# Patient Record
Sex: Female | Born: 1970 | Race: White | Hispanic: No | Marital: Married | Smoking: Never smoker
Health system: Southern US, Community
[De-identification: ages and names within clinical notes are randomized; demographics above are authoritative.]

## PROBLEM LIST (undated history)

## (undated) DIAGNOSIS — E039 Hypothyroidism, unspecified: Secondary | ICD-10-CM

## (undated) DIAGNOSIS — E079 Disorder of thyroid, unspecified: Secondary | ICD-10-CM

## (undated) DIAGNOSIS — Z01419 Encounter for gynecological examination (general) (routine) without abnormal findings: Secondary | ICD-10-CM

## (undated) DIAGNOSIS — Z8639 Personal history of other endocrine, nutritional and metabolic disease: Secondary | ICD-10-CM

## (undated) DIAGNOSIS — R928 Other abnormal and inconclusive findings on diagnostic imaging of breast: Secondary | ICD-10-CM

## (undated) HISTORY — DX: Disorder of thyroid, unspecified: E07.9

## (undated) HISTORY — DX: Other abnormal and inconclusive findings on diagnostic imaging of breast: R92.8

## (undated) HISTORY — DX: Encounter for gynecological examination (general) (routine) without abnormal findings: Z01.419

## (undated) HISTORY — DX: Personal history of other endocrine, nutritional and metabolic disease: Z86.39

## (undated) HISTORY — DX: Hypothyroidism, unspecified: E03.9

---
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
## 2015-06-04 DIAGNOSIS — R928 Other abnormal and inconclusive findings on diagnostic imaging of breast: Secondary | ICD-10-CM | POA: Insufficient documentation

## 2015-06-29 DIAGNOSIS — R748 Abnormal levels of other serum enzymes: Secondary | ICD-10-CM | POA: Insufficient documentation

## 2015-06-29 DIAGNOSIS — O905 Postpartum thyroiditis: Secondary | ICD-10-CM | POA: Insufficient documentation

## 2015-06-29 DIAGNOSIS — E041 Nontoxic single thyroid nodule: Secondary | ICD-10-CM | POA: Insufficient documentation

## 2015-06-29 DIAGNOSIS — E038 Other specified hypothyroidism: Secondary | ICD-10-CM | POA: Insufficient documentation

## 2015-06-29 LAB — BASIC METABOLIC PANEL
BUN: 15 mg/dL (ref 4–21)
CREATININE: 0.7 mg/dL (ref 0.5–1.1)
Glucose: 71 mg/dL
Potassium: 4 mmol/L (ref 3.4–5.3)
SODIUM: 140 mmol/L (ref 137–147)

## 2015-06-29 LAB — HEPATIC FUNCTION PANEL
ALT: 9 U/L (ref 7–35)
AST: 14 U/L (ref 13–35)

## 2015-06-29 LAB — TSH: TSH: 2.76 u[IU]/mL (ref 0.41–5.90)

## 2015-07-01 LAB — T4
T3, Free: 3.5
T4, Total: 0.6
Thyroid Peroxidase Ab: 421

## 2015-07-15 ENCOUNTER — Ambulatory Visit (INDEPENDENT_AMBULATORY_CARE_PROVIDER_SITE_OTHER): Payer: BLUE CROSS/BLUE SHIELD | Admitting: Osteopathic Medicine

## 2015-07-15 ENCOUNTER — Encounter: Payer: Self-pay | Admitting: Osteopathic Medicine

## 2015-07-15 VITALS — BP 107/73 | HR 63 | Temp 98.1°F | Wt 177.2 lb

## 2015-07-15 DIAGNOSIS — R928 Other abnormal and inconclusive findings on diagnostic imaging of breast: Secondary | ICD-10-CM

## 2015-07-15 DIAGNOSIS — Z01419 Encounter for gynecological examination (general) (routine) without abnormal findings: Secondary | ICD-10-CM

## 2015-07-15 DIAGNOSIS — E038 Other specified hypothyroidism: Secondary | ICD-10-CM

## 2015-07-15 DIAGNOSIS — Z13220 Encounter for screening for lipoid disorders: Secondary | ICD-10-CM

## 2015-07-15 DIAGNOSIS — E039 Hypothyroidism, unspecified: Secondary | ICD-10-CM

## 2015-07-15 DIAGNOSIS — Z0000 Encounter for general adult medical examination without abnormal findings: Secondary | ICD-10-CM

## 2015-07-15 DIAGNOSIS — Z8639 Personal history of other endocrine, nutritional and metabolic disease: Secondary | ICD-10-CM

## 2015-07-15 DIAGNOSIS — E034 Atrophy of thyroid (acquired): Secondary | ICD-10-CM

## 2015-07-15 HISTORY — DX: Personal history of other endocrine, nutritional and metabolic disease: Z86.39

## 2015-07-15 HISTORY — DX: Hypothyroidism, unspecified: E03.9

## 2015-07-15 HISTORY — DX: Encounter for gynecological examination (general) (routine) without abnormal findings: Z01.419

## 2015-07-15 HISTORY — DX: Other abnormal and inconclusive findings on diagnostic imaging of breast: R92.8

## 2015-07-15 NOTE — Progress Notes (Signed)
HPI: Julia Flynn is a 44 y.o. female who presents to [facility]  today for chief complaint of:  Chief Complaint  Patient presents with  . New Patient (Initial Visit)    thyroid problem  . Annual Exam    Looking for new doctor - went to Endocrinologist recently and had labs done. Reviewed these as well as Pap and Mammo results.    Past medical, social and family history reviewed: Past Medical History  Diagnosis Date  . Thyroid disease    Past Surgical History  Procedure Laterality Date  . Cesarean section N/A 1999-2001-2007-2011   Social History  Substance Use Topics  . Smoking status: Never Smoker   . Smokeless tobacco: Not on file  . Alcohol Use: Not on file   No family history on file.  Current Outpatient Prescriptions  Medication Sig Dispense Refill  . levothyroxine (SYNTHROID, LEVOTHROID) 88 MCG tablet Take 88 mcg by mouth daily before breakfast.    . liothyronine (CYTOMEL) 5 MCG tablet Take 5 mcg by mouth daily. Pt takes 2.76mcg daily    . Loratadine 10 MG CAPS Take 10 mg by mouth.     No current facility-administered medications for this visit.   Allergies  Allergen Reactions  . Morphine Itching      Review of Systems: CONSTITUTIONAL:  No  fever, no chills, No  unintentional weight changes HEAD/EYES/EARS/NOSE/THROAT: No headache, no vision change, no hearing change, No  sore throat CARDIAC: No chest pain, no pressure/palpitations, no orthopnea RESPIRATORY: No  cough, No  shortness of breath/wheeze GASTROINTESTINAL: No nausea, no vomiting, no abdominal pain, no blood in stool, no diarrhea, no constipation MUSCULOSKELETAL: No  myalgia/arthralgia GENITOURINARY: No incontinence, No abnormal genital bleeding/discharge SKIN: No rash/wounds/concerning lesions HEM/ONC: No easy bruising/bleeding, no abnormal lymph node ENDOCRINE: No polyuria/polydipsia/polyphagia, no heat/cold intolerance  NEUROLOGIC: No weakness, no dizziness, no  slurred speech PSYCHIATRIC: No concerns with depression, no concerns with anxiety, no sleep problems    Exam:  BP 107/73 mmHg  Pulse 63  Temp(Src) 98.1 F (36.7 C)  Wt 177 lb 4 oz (80.4 kg)  LMP 07/07/2015 Constitutional: VSS, see above. General Appearance: alert, well-developed, well-nourished, NAD Eyes: Normal lids and conjunctive, non-icteric sclera, PERRLA Ears, Nose, Mouth, Throat: Normal external inspection ears/nares/mouth/lips/gums, TM normal bilaterally, MMM, posterior pharynx No  erythema No  exudate Neck: No masses, trachea midline. No thyroid enlargement/tenderness/mass appreciated. No lymphadenopathy Respiratory: Normal respiratory effort. no wheeze, no rhonchi, no rales Cardiovascular: S1/S2 normal, no murmur, no rub/gallop auscultated. RRR.  No carotid bruit or JVD. No abdominal aortic bruit.  Pedal pulse II/IV bilaterally DP and PT.  No lower extremity edema. Gastrointestinal: Nontender, no masses. No hepatomegaly, no splenomegaly. No hernia appreciated. Bowel sounds normal. Rectal exam deferred.  Musculoskeletal: Gait normal. No clubbing/cyanosis of digits.  Neurological: No cranial nerve deficit on limited exam. Motor and sensation intact and symmetric Psychiatric: Normal judgment/insight. Normal mood and affect. Oriented x3.    No results found for this or any previous visit (from the past 72 hour(s)).  Patient brings results: Thyroid is normal CMP normal Mammo Birads 0  Pap NILM HPV neg   ASSESSMENT/PLAN:  No diagnosis found.   FEMALE PREVENTIVE CARE  ANNUAL SCREENING/COUNSELING Tobacco - Never  Alcohol - none Diet/Exercise - HEALTHY HABITS DISCUSSED TO DECREASE CV RISK - walks 3 -4 times per week, cardio fitness weekly Sexual Health - Yes with female. STI - The patient denies history of sexually transmitted disease. INTERESTED IN STI TESTING -  no Depression - PQH2 Negative Domestic violence concerns - no HTN SCREENING - SEE VITALS Vaccination  status - SEE BELOW  INFECTIOUS DISEASE SCREENING HIV - all adults 15-65 - does not need GC/CT - sexually active - does not need HepC - born [id number] - does not need TB - if risk/required by employer - does not need  DISEASE SCREENING Lipid - (Low risk screen M35/F45; High risk screen M25/F35 if HTN, Tob, FH CHD M<55/F<65) - needs DM2 (45+ or Risk = FH 1st deg DM, Hx GDM, overweight/sedentary, high-risk ethnicity, HTN) - needs Osteoporosis - age 9+ or one sooner if risk - does not need  CANCER SCREENING Cervical - Pap q3 yr age 45+, Pap + HPV q5y age 45+ - PAP - does not need Breast - Mammo age 440+ (C) and biennial age 950-75 (A) - MAMMO - does not need Lung - annual low dose CT Chest age 45-75 w/ 30+ PY, current/quit past 15 years - CT - does not need Colon - age 45+ or 45 years of age prior to FH Dx - GI REFERRAL - does not need  ADULT VACCINATION Influenza - annual - was offered and declined by the patient Td booster every 10 years - was not indicated HPV - age 20<26yo - was not indicated Zoster - age 45+ - was not indicated Pneumonia - age 9+ sooner if risk (DM, smoker, other) - was not indicated  OTHER Fall - exercise and Vit D age 9+ - does not need Consider ASA - age 45-59 - does not need  Return in about 1 year (around 07/14/2016), or sooner if any problems arise, for ANNUAL PHYSICAL.

## 2015-07-27 ENCOUNTER — Encounter: Payer: Self-pay | Admitting: Emergency Medicine

## 2015-07-27 LAB — ESTIMATED GFR: EGFR (Non-African Amer.): >60

## 2015-10-04 ENCOUNTER — Encounter: Payer: Self-pay | Admitting: Emergency Medicine

## 2015-10-04 ENCOUNTER — Emergency Department (INDEPENDENT_AMBULATORY_CARE_PROVIDER_SITE_OTHER)
Admission: EM | Admit: 2015-10-04 | Discharge: 2015-10-04 | Disposition: A | Discharge status: Home / Self Care | Payer: BLUE CROSS/BLUE SHIELD | Source: Home / Self Care | Attending: Family Medicine | Admitting: Family Medicine

## 2015-10-04 DIAGNOSIS — J069 Acute upper respiratory infection, unspecified: Secondary | ICD-10-CM | POA: Diagnosis not present

## 2015-10-04 DIAGNOSIS — B9789 Other viral agents as the cause of diseases classified elsewhere: Principal | ICD-10-CM

## 2015-10-04 LAB — POCT RAPID STREP A (OFFICE): Rapid Strep A Screen: NEGATIVE

## 2015-10-04 NOTE — ED Notes (Signed)
Patient reports onset of cold-like symptoms 4 days ago; denies fever; some hoarseness.

## 2015-10-04 NOTE — Discharge Instructions (Signed)
Take plain guaifenesin (  extended release tabs such as Mucinex) twice daily, with plenty of water, for cough and congestion.  May add Pseudoephedrine ( , one or two every 4 to 6 hours) for sinus congestion.  Get adequate rest.   May use Afrin nasal spray (or generic oxymetazoline) twice daily for about 5 days and then discontinue.  Also recommend using saline nasal spray several times daily and saline nasal irrigation (AYR is a common brand).   Try warm salt water gargles for sore throat.  Stop all antihistamines for now, and other non-prescription cough/cold preparations. May take Ibuprofen , 4 tabs every 8 hours with food for headache, body aches, etc. May take Delsym Cough Suppressant at bedtime for nighttime cough.  Follow-up with family doctor if not improving about10 days.

## 2015-10-04 NOTE — ED Provider Notes (Signed)
CSN: [id number]     Arrival date & time 10/04/15  1549 History   First MD Initiated Contact with Patient 10/04/15 1645     Chief Complaint  Patient presents with  . Hoarse  . Sore Throat     HPI Comments: Patient complains of four day history of typical cold-like symptoms including mild sore throat, sinus congestion, fatigue, and cough.   The history is provided by the patient.    Past Medical History  Diagnosis Date  . Thyroid disease   . H/O Hashimoto thyroiditis 07/15/2015  . Hypothyroidism 07/15/2015  . Abnormal mammogram 07/15/2015    BIRADS-05/25/2015, following every 6 months to follow califications  . Well woman exam 07/15/2015    Pap NILM, HPV neg 2014   Past Surgical History  Procedure Laterality Date  . Cesarean section N/A 1999-2001-2007-2011   History reviewed. No pertinent family history. Social History  Substance Use Topics  . Smoking status: Never Smoker   . Smokeless tobacco: None  . Alcohol Use: None   OB History    No data available     Review of Systems + sore throat + hoarse + cough No pleuritic pain + wheezing + nasal congestion + post-nasal drainage No sinus pain/pressure No itchy/red eyes No earache No hemoptysis No SOB No fever/chills No nausea No vomiting No abdominal pain No diarrhea No urinary symptoms No skin rash + fatigue No myalgias No headache Used OTC meds without relief  Allergies  Morphine  Home Medications   Prior to Admission medications   Medication Sig Start Date End Date Taking? Authorizing Provider  levothyroxine (SYNTHROID, LEVOTHROID) 88 MCG tablet Take 88 mcg by mouth daily before breakfast.    Historical Provider, MD  liothyronine (CYTOMEL) 5 MCG tablet Take 5 mcg by mouth daily. Pt takes 2.20mcg daily    Historical Provider, MD  Loratadine 10 MG CAPS Take 10 mg by mouth.    Historical Provider, MD   Meds Ordered and Administered this Visit  Medications - No data to display  BP 90/57 mmHg  Pulse 57   Temp(Src) 98 F (36.7 C) (Oral)  Resp 16  Ht  (1.727 m)  Wt 175 lb (79.379 kg)  BMI 26.61 kg/m2  SpO2 99%  LMP 10/04/2015 (Exact Date) No data found.   Physical Exam Nursing notes and Vital Signs reviewed. Appearance:  Patient appears stated age, and in no acute distress Eyes:  Pupils are equal, round, and reactive to light and accomodation.  Extraocular movement is intact.  Conjunctivae are not inflamed  Ears:  Canals normal.  Tympanic membranes normal.  Nose:  Mildly congested turbinates.  No sinus tenderness.     Pharynx:  Normal Neck:  Supple.  Tonsillar nodes are tender but not enlarged. Tender enlarged posterior nodes are palpated bilaterally  Lungs:  Clear to auscultation.  Breath sounds are equal.  Moving air well. Heart:  Regular rate and rhythm without murmurs, rubs, or gallops.  Abdomen:  Nontender without masses or hepatosplenomegaly.  Bowel sounds are present.  No CVA or flank tenderness.  Extremities:  No edema.  Skin:  No rash present.   ED Course  Procedures none    Labs Reviewed  POCT RAPID STREP A (OFFICE) negative      MDM   1. Viral URI with cough     There is no evidence of bacterial infection today.   Take plain guaifenesin (  extended release tabs such as Mucinex) twice daily, with plenty of water, for cough  and congestion.  May add Pseudoephedrine ( , one or two every 4 to 6 hours) for sinus congestion.  Get adequate rest.   May use Afrin nasal spray (or generic oxymetazoline) twice daily for about 5 days and then discontinue.  Also recommend using saline nasal spray several times daily and saline nasal irrigation (AYR is a common brand).   Try warm salt water gargles for sore throat.  Stop all antihistamines for now, and other non-prescription cough/cold preparations. May take Ibuprofen , 4 tabs every 8 hours with food for headache, body aches, etc. May take Delsym Cough Suppressant at bedtime for nighttime cough.  Follow-up with  family doctor if not improving about10 days.     Lattie Haw, MD 10/07/15 [phone]

## 2016-03-01 ENCOUNTER — Telehealth: Payer: Self-pay | Admitting: Osteopathic Medicine

## 2016-03-01 NOTE — Telephone Encounter (Signed)
I received a call from patient and she is requesting to switch PCP from Dr. Lyn HollingsheadAlexander to Julia Flynn. I have scheduled her on Monday 6/26 with Jade if this is a problem please let me know. Thank you Arline Aspindy

## 2016-03-01 NOTE — Telephone Encounter (Signed)
Ok with me 

## 2016-03-07 ENCOUNTER — Encounter: Payer: Self-pay | Admitting: Physician Assistant

## 2016-03-07 ENCOUNTER — Ambulatory Visit (INDEPENDENT_AMBULATORY_CARE_PROVIDER_SITE_OTHER): Payer: BLUE CROSS/BLUE SHIELD | Admitting: Physician Assistant

## 2016-03-07 VITALS — BP 104/55 | HR 62 | Ht 68.0 in | Wt 183.0 lb

## 2016-03-07 DIAGNOSIS — E039 Hypothyroidism, unspecified: Secondary | ICD-10-CM | POA: Diagnosis not present

## 2016-03-07 DIAGNOSIS — Z8639 Personal history of other endocrine, nutritional and metabolic disease: Secondary | ICD-10-CM | POA: Diagnosis not present

## 2016-03-07 DIAGNOSIS — L821 Other seborrheic keratosis: Secondary | ICD-10-CM

## 2016-03-07 DIAGNOSIS — L301 Dyshidrosis [pompholyx]: Secondary | ICD-10-CM | POA: Diagnosis not present

## 2016-03-07 DIAGNOSIS — R928 Other abnormal and inconclusive findings on diagnostic imaging of breast: Secondary | ICD-10-CM

## 2016-03-07 MED ORDER — TRIAMCINOLONE ACETONIDE 0.1 % EX CREA: 1.0000 "application " | TOPICAL_CREAM | Freq: Two times a day (BID) | CUTANEOUS | Status: DC

## 2016-03-07 NOTE — Progress Notes (Signed)
   Subjective:    Patient ID: Julia Flynn, female    DOB: 10/02/1970, 45 y.o.   MRN: [id number]  HPI  Pt is a 45 yo female who presents to the clinic to have mole on her back looked at. Noticed growth for last 2 weeks. No bleeding. Bra irritates it. Moles removed in past no cancerous moles.   Hx of abnormal mammogram of left breast with calcifcations. Needs additional imaging.   Hx of dyshidrotic eczema. Brings in triamcinolone cream that needs refill on. Controlled with as needed cream.    Review of Systems  All other systems reviewed and are negative.      Objective:   Physical Exam  Constitutional: She is oriented to person, place, and time. She appears well-developed and well-nourished.  HENT:  Head: Normocephalic and atraumatic.  Cardiovascular: Normal rate, regular rhythm and normal heart sounds.   Pulmonary/Chest: Effort normal and breath sounds normal.  Neurological: She is alert and oriented to person, place, and time.  Skin:     Psychiatric: She has a normal mood and affect. Her behavior is normal.          Assessment & Plan:  Abnormal mammogram- ordered left diagnostic to follow up on breast calcifications. Last mammogram done at [facility] and in care everywhere.   Dyshidrotic eczema- triamcinolone cream as needed given.   seborrheic keratoses- cryotherapy done today. Reassurance given.  Cryotherapy Procedure Note  Pre-operative Diagnosis: seborrheic kerotosis  Post-operative Diagnosis: seborrheic kerotosis  Locations: mid back/bra line to the left of back.  Indications: irritation  Anesthesia: not required   Procedure Details  History of allergy to iodine: no. Pacemaker? no.  Patient informed of risks (permanent scarring, infection, light or dark discoloration, bleeding, infection, weakness, numbness and recurrence of the lesion) and benefits of the procedure and verbal informed consent obtained.  The areas are treated with liquid nitrogen therapy,  frozen until ice ball extended 2 mm beyond lesion, allowed to thaw, and treated again. The patient tolerated procedure well.  The patient was instructed on post-op care, warned that there may be blister formation, redness and pain. Recommend OTC analgesia as needed for pain.  Condition: Stable  Complications: none.  Plan: 1. Instructed to keep the area dry and covered for 24-48h and clean thereafter. 2. Warning signs of infection were reviewed.   3. Recommended that the patient use OTC acetaminophen as needed for pain.

## 2016-03-07 NOTE — Patient Instructions (Signed)
Seborrheic Keratosis Seborrheic keratosis is a common, noncancerous (benign) skin growth. This condition causes waxy, rough, tan, brown, or black spots to appear on the skin. These skin growths can be flat or raised. CAUSES The cause of this condition is not known. RISK FACTORS This condition is more likely to develop in:  People who have a family history of seborrheic keratosis.  People who are 50 or older.  People who are pregnant.  People who have had estrogen replacement therapy. SYMPTOMS This condition often occurs on the face, chest, shoulders, back, or other areas. These growths:  Are usually painless, but may become irritated and itchy.  Can be yellow, brown, black, or other colors.  Are slightly raised or have a flat surface.  Are sometimes rough or wart-like in texture.  Are often waxy on the surface.  Are round or oval-shaped.  Sometimes look like they are "stuck on."  Often occur in groups, but may occur as a single growth. DIAGNOSIS This condition is diagnosed with a medical history and physical exam. A sample of the growth may be tested (skin biopsy). You may need to see a skin specialist (dermatologist). TREATMENT Treatment is not usually needed for this condition, unless the growths are irritated or are often bleeding. You may also choose to have the growths removed if you do not like their appearance. Most commonly, these growths are treated with a procedure in which liquid nitrogen is applied to "freeze" off the growth (cryosurgery). They may also be burned off with electricity or cut off. HOME CARE INSTRUCTIONS  Watch your growth for any changes.  Keep all follow-up visits as told by your health care provider. This is important.  Do not scratch or pick at the growth or growths. This can cause them to become irritated or infected. SEEK MEDICAL CARE IF:  You suddenly have many new growths.  Your growth bleeds, itches, or hurts.  Your growth suddenly  becomes larger or changes color.   This information is not intended to replace advice given to you by your health care provider. Make sure you discuss any questions you have with your health care provider.   Document Released: 10/01/2010 Document Revised: 05/20/2015 Document Reviewed: 01/14/2015 Elsevier Interactive Patient Education 2016 Elsevier Inc.  

## 2016-03-25 ENCOUNTER — Ambulatory Visit: Admission: RE | Admit: 2016-03-25 | Payer: BLUE CROSS/BLUE SHIELD | Source: Ambulatory Visit

## 2016-03-25 ENCOUNTER — Other Ambulatory Visit: Payer: Self-pay | Admitting: Physician Assistant

## 2016-03-25 ENCOUNTER — Ambulatory Visit
Admission: RE | Admit: 2016-03-25 | Discharge: 2016-03-25 | Disposition: A | Discharge status: Home / Self Care | Payer: BLUE CROSS/BLUE SHIELD | Source: Ambulatory Visit | Attending: Physician Assistant | Admitting: Physician Assistant

## 2016-03-25 DIAGNOSIS — R928 Other abnormal and inconclusive findings on diagnostic imaging of breast: Secondary | ICD-10-CM

## 2016-06-10 ENCOUNTER — Emergency Department (INDEPENDENT_AMBULATORY_CARE_PROVIDER_SITE_OTHER)
Admission: EM | Admit: 2016-06-10 | Discharge: 2016-06-10 | Disposition: A | Discharge status: Home / Self Care | Payer: BLUE CROSS/BLUE SHIELD | Source: Home / Self Care | Attending: Family Medicine | Admitting: Family Medicine

## 2016-06-10 ENCOUNTER — Encounter: Payer: Self-pay | Admitting: Emergency Medicine

## 2016-06-10 DIAGNOSIS — B9789 Other viral agents as the cause of diseases classified elsewhere: Principal | ICD-10-CM

## 2016-06-10 DIAGNOSIS — J069 Acute upper respiratory infection, unspecified: Secondary | ICD-10-CM

## 2016-06-10 LAB — POCT RAPID STREP A (OFFICE): Rapid Strep A Screen: NEGATIVE

## 2016-06-10 MED ORDER — AZITHROMYCIN 250 MG PO TABS: ORAL_TABLET | ORAL | 0 refills | Status: DC

## 2016-06-10 NOTE — ED Provider Notes (Signed)
[facility]    CSN: [id number] Arrival date & time: 06/10/16  1340     History   Chief Complaint Chief Complaint  Patient presents with  . URI    HPI Julia Flynn is a 45 y.o. female.   Three days ago patient developed myalgias, fatigue, fever to 101+, and frontal headache.  She developed a productive cough the next day.  Her sore throat has become worse, and her fever persists.   The history is provided by the patient.    Past Medical History:  Diagnosis Date  . Abnormal mammogram 07/15/2015   BIRADS-05/25/2015, following every 6 months to follow califications  . H/O Hashimoto thyroiditis 07/15/2015  . Hypothyroidism 07/15/2015  . Thyroid disease   . Well woman exam 07/15/2015   Pap NILM, HPV neg 2014    Patient Active Problem List   Diagnosis Date Noted  . Dyshidrotic eczema 03/07/2016  . Seborrheic keratoses 03/07/2016  . H/O Hashimoto thyroiditis 07/15/2015  . Hypothyroidism 07/15/2015  . Abnormal mammogram 07/15/2015  . Well woman exam 07/15/2015    Past Surgical History:  Procedure Laterality Date  . CESAREAN SECTION N/A 1999-2001-2007-2011    OB History    No data available       Home Medications    Prior to Admission medications   Medication Sig Start Date End Date Taking? Authorizing Provider  acetaminophen (TYLENOL) 325 MG tablet Take 650 mg by mouth every 6 (six) hours as needed.   Yes Historical Provider, MD  ibuprofen (ADVIL,MOTRIN) 200 MG tablet Take 200 mg by mouth every 6 (six) hours as needed.   Yes Historical Provider, MD  azithromycin (ZITHROMAX Z-PAK) 250 MG tablet Take 2 tabs today; then begin one tab once daily for 4 more days. (Rx void after 06/18/16) 06/10/16   Lattie HawStephen A Jaylinn Hellenbrand, MD  levothyroxine (SYNTHROID, LEVOTHROID) 88 MCG tablet Take 88 mcg by mouth daily before breakfast.    Historical Provider, MD  Loratadine 10 MG CAPS Take 10 mg by mouth.    Historical Provider, MD  triamcinolone cream (KENALOG) 0.1 % Apply 1  application topically 2 (two) times daily. 03/07/16   Jomarie LongsJade L Breeback, PA-C    Family History No family history on file.  Social History Social History  Substance Use Topics  . Smoking status: Never Smoker  . Smokeless tobacco: Never Used  . Alcohol use Not on file     Allergies   Morphine   Review of Systems Review of Systems + sore throat + cough No pleuritic pain No wheezing + nasal congestion + post-nasal drainage No sinus pain/pressure No itchy/red eyes No earache No hemoptysis + SOB No fever, + chills No nausea No vomiting No abdominal pain No diarrhea No urinary symptoms No skin rash + fatigue + myalgias + headache Used OTC meds without relief   Physical Exam Triage Vital Signs ED Triage Vitals  Enc Vitals Group     BP 06/10/16 1421 104/71     Pulse Rate 06/10/16 1421 69     Resp --      Temp 06/10/16 1421 98.9 F (37.2 C)     Temp Source 06/10/16 1421 Oral     SpO2 06/10/16 1421 98 %     Weight 06/10/16 1421 180 lb (81.6 kg)     Height 06/10/16 1421 5\' 7"  (1.702 m)     Head Circumference --      Peak Flow --      Pain Score 06/10/16 1424 3  Pain Loc --      Pain Edu? --      Excl. in GC? --    No data found.   Updated Vital Signs BP 104/71 (BP Location: Left Arm)   Pulse 69   Temp 98.9 F (37.2 C) (Oral)   Ht 5\' 7"  (1.702 m)   Wt 180 lb (81.6 kg)   LMP 06/02/2016   SpO2 98%   BMI 28.19 kg/m   Visual Acuity Right Eye Distance:   Left Eye Distance:   Bilateral Distance:    Right Eye Near:   Left Eye Near:    Bilateral Near:     Physical Exam Nursing notes and Vital Signs reviewed. Appearance:  Patient appears stated age, and in no acute distress Eyes:  Pupils are equal, round, and reactive to light and accomodation.  Extraocular movement is intact.  Conjunctivae are not inflamed  Ears:  Canals normal.  Tympanic membranes normal.  Nose:  Mildly congested turbinates.  No sinus tenderness.   Pharynx:  Mildly  erythematous;  Uvula edematous Neck:  Supple.  Tender enlarged posterior/lateral nodes are palpated bilaterally.  Tonsillar nodes tender but not enlarged.  Lungs:  Clear to auscultation.  Breath sounds are equal.  Moving air well. Heart:  Regular rate and rhythm without murmurs, rubs, or gallops.  Abdomen:  Nontender without masses or hepatosplenomegaly.  Bowel sounds are present.  No CVA or flank tenderness.  Extremities:  No edema.  Skin:  No rash present.    UC Treatments / Results  Labs (all labs ordered are listed, but only abnormal results are displayed) POCT rapid strep test negative  EKG  EKG Interpretation None       Radiology No results found.  Procedures Procedures (including critical care time)  Medications Ordered in UC Medications - No data to display   Initial Impression / Assessment and Plan / UC Course  I have reviewed the triage vital signs and the nursing notes.  Pertinent labs & imaging results that were available during my care of the patient were reviewed by me and considered in my medical decision making (see chart for details).  Clinical Course  There is no evidence of bacterial infection today.   Take plain guaifenesin (1200mg  extended release tabs such as Mucinex) twice daily, with plenty of water, for cough and congestion.  May add Pseudoephedrine (30mg , one or two every 4 to 6 hours) for sinus congestion.  Get adequate rest.   May use Afrin nasal spray (or generic oxymetazoline) twice daily for about 5 days and then discontinue.  Also recommend using saline nasal spray several times daily and saline nasal irrigation (AYR is a common brand).  Use Flonase nasal spray each morning after using Afrin nasal spray and saline nasal irrigation. Try warm salt water gargles for sore throat.  Stop all antihistamines for now, and other non-prescription cough/cold preparations. May take Ibuprofen 200mg , 4 tabs every 8 hours with food for sore throat, body aches,  etc. May take Delsym Cough Suppressant at bedtime for nighttime cough.  Begin Azithromycin if not improving about one week or if persistent fever develops (Given a prescription to hold, with an expiration date)  Follow-up with family doctor if not improving about10 days.     Final Clinical Impressions(s) / UC Diagnoses   Final diagnoses:  Viral URI with cough    New Prescriptions New Prescriptions   AZITHROMYCIN (ZITHROMAX Z-PAK) 250 MG TABLET    Take 2 tabs today; then begin one  tab once daily for 4 more days. (Rx void after 06/18/16)     Lattie Haw, MD 06/10/16 [phone]

## 2016-06-10 NOTE — Discharge Instructions (Signed)
Take plain guaifenesin (1200mg  extended release tabs such as Mucinex) twice daily, with plenty of water, for cough and congestion.  May add Pseudoephedrine (30mg , one or two every 4 to 6 hours) for sinus congestion.  Get adequate rest.   May use Afrin nasal spray (or generic oxymetazoline) twice daily for about 5 days and then discontinue.  Also recommend using saline nasal spray several times daily and saline nasal irrigation (AYR is a common brand).  Use Flonase nasal spray each morning after using Afrin nasal spray and saline nasal irrigation. Try warm salt water gargles for sore throat.  Stop all antihistamines for now, and other non-prescription cough/cold preparations. May take Ibuprofen 200mg , 4 tabs every 8 hours with food for sore throat, body aches, etc. May take Delsym Cough Suppressant at bedtime for nighttime cough.  Begin Azithromycin if not improving about one week or if persistent fever develops   Follow-up with family doctor if not improving about10 days.

## 2016-06-10 NOTE — ED Triage Notes (Signed)
4 Days Fever, body aches, productive cough with yellow mucus, fatigue, slight sore throat.

## 2016-06-11 ENCOUNTER — Telehealth: Payer: Self-pay | Admitting: Emergency Medicine

## 2016-06-11 LAB — STREP A DNA PROBE: GASP: NOT DETECTED

## 2016-06-17 NOTE — Telephone Encounter (Signed)
-----   Message from Jomarie LongsJade L Sharline Lehane, [place]PA-C sent at 03/30/2016  9:07 AM EDT ----- Repeat diagnostic mammogram.

## 2017-01-11 LAB — HM MAMMOGRAPHY

## 2017-02-17 ENCOUNTER — Encounter: Payer: Self-pay | Admitting: Osteopathic Medicine

## 2017-03-14 ENCOUNTER — Telehealth: Payer: Self-pay | Admitting: *Deleted

## 2017-03-14 DIAGNOSIS — E034 Atrophy of thyroid (acquired): Secondary | ICD-10-CM

## 2017-03-14 NOTE — Telephone Encounter (Signed)
Pt called requesting referral for endo referral for [facility]. Referral placed.Julia PacasBarkley, Nabil Bubolz Wonder Lake[place]

## 2017-03-30 ENCOUNTER — Telehealth: Payer: Self-pay | Admitting: Physician Assistant

## 2017-03-30 ENCOUNTER — Telehealth: Payer: Self-pay | Admitting: *Deleted

## 2017-03-30 DIAGNOSIS — E034 Atrophy of thyroid (acquired): Secondary | ICD-10-CM

## 2017-03-30 DIAGNOSIS — Z8639 Personal history of other endocrine, nutritional and metabolic disease: Secondary | ICD-10-CM

## 2017-03-30 NOTE — Telephone Encounter (Signed)
Pt states she sees an endocrinologist but wants to switch and needs a referral put in. She is wanting to switch to [facility] and see Autumn Jones. Phone number to the office is [phone]. She stated she spoke to them and they asked if we can send last office notes and labs to attention Latoya. Thanks

## 2017-03-30 NOTE — Telephone Encounter (Signed)
Referral to a new Endocrinologist placed per pt request.

## 2017-03-30 NOTE — Telephone Encounter (Signed)
Referral placed.

## 2017-06-13 DIAGNOSIS — E063 Autoimmune thyroiditis: Secondary | ICD-10-CM | POA: Diagnosis not present

## 2017-06-13 DIAGNOSIS — E041 Nontoxic single thyroid nodule: Secondary | ICD-10-CM | POA: Diagnosis not present

## 2017-06-13 DIAGNOSIS — E038 Other specified hypothyroidism: Secondary | ICD-10-CM | POA: Diagnosis not present

## 2018-05-04 DIAGNOSIS — Z1231 Encounter for screening mammogram for malignant neoplasm of breast: Secondary | ICD-10-CM | POA: Diagnosis not present

## 2018-05-07 LAB — HM MAMMOGRAPHY

## 2018-05-17 ENCOUNTER — Encounter: Payer: Self-pay | Admitting: Osteopathic Medicine

## 2018-06-08 DIAGNOSIS — E038 Other specified hypothyroidism: Secondary | ICD-10-CM | POA: Diagnosis not present

## 2018-06-08 DIAGNOSIS — E063 Autoimmune thyroiditis: Secondary | ICD-10-CM | POA: Diagnosis not present

## 2018-06-08 DIAGNOSIS — E041 Nontoxic single thyroid nodule: Secondary | ICD-10-CM | POA: Diagnosis not present

## 2018-06-15 DIAGNOSIS — E041 Nontoxic single thyroid nodule: Secondary | ICD-10-CM | POA: Diagnosis not present

## 2018-12-03 ENCOUNTER — Ambulatory Visit: Payer: Self-pay | Admitting: Allergy

## 2018-12-03 ENCOUNTER — Other Ambulatory Visit: Payer: Self-pay

## 2018-12-03 ENCOUNTER — Encounter: Payer: Self-pay | Admitting: Allergy

## 2018-12-03 ENCOUNTER — Ambulatory Visit (INDEPENDENT_AMBULATORY_CARE_PROVIDER_SITE_OTHER): Payer: BLUE CROSS/BLUE SHIELD | Admitting: Allergy

## 2018-12-03 VITALS — BP 88/54 | HR 64 | Temp 98.0°F | Resp 16 | Ht 68.0 in | Wt 167.4 lb

## 2018-12-03 DIAGNOSIS — J3089 Other allergic rhinitis: Secondary | ICD-10-CM | POA: Diagnosis not present

## 2018-12-03 DIAGNOSIS — L2089 Other atopic dermatitis: Secondary | ICD-10-CM

## 2018-12-03 DIAGNOSIS — H1013 Acute atopic conjunctivitis, bilateral: Secondary | ICD-10-CM | POA: Diagnosis not present

## 2018-12-03 DIAGNOSIS — T781XXD Other adverse food reactions, not elsewhere classified, subsequent encounter: Secondary | ICD-10-CM | POA: Diagnosis not present

## 2018-12-03 MED ORDER — OLOPATADINE HCL 0.2 % OP SOLN: 1.0000 [drp] | Freq: Every day | OPHTHALMIC | 3 refills | Status: DC | PRN

## 2018-12-03 MED ORDER — EPINEPHRINE 0.3 MG/0.3ML IJ SOAJ: 0.3000 mg | INTRAMUSCULAR | 1 refills | Status: AC | PRN

## 2018-12-03 NOTE — Patient Instructions (Addendum)
Allergies  -Environmental allergy skin prick testing today is positive to weed pollens, weed pollens, tree pollen, molds, dust mites, cat, dog, mouse.  -Allergen avoidance measures discussed/handouts provided  -Resume Claritin 10 mg 1 tablet daily.   Can also try other long-acting antihistamines like Zyrtec 10mg , Allegra 180mg  or Xyzal 5mg  daily.    -For itchy/watery/red eyes use over-the-counter Pataday 1 drop each eye daily as needed  -Recommend use of saline rinse to help flush and clean out the sinuses  -Allergen immunotherapy discussed today including protocol, benefits and risk.  Informational handout provided.  If interested in this therapuetic option you can check with your insurance carrier for coverage.  Will prescribe with epinephrine device (Epipen or AuviQ) to bring on days of your injections.   Eczema  -Continue daily moisturization with emollients like Vanicream, Eucerin, CeraVe  -trial use of Eucrisa, non-steroidal agent, on areas of eczema flares (red/patchy/itchy/dry areas) 1-2 times a day until improved.  Pam Drown can be used on the face and body if needed.    Adverse food reaction  -Select food allergy skin testing is negative to peanut and shellfish panel.   At this time will elect to continue avoidance of these foods.    -Have access to self-injectable epinephrine (Epipen or AuviQ) 0.3mg  at all times  -Follow emergency action plan in case of allergic reaction  Follow-up in 4 to 6 months or sooner if needed

## 2018-12-03 NOTE — Addendum Note (Signed)
Addended by: Maryjean Morn D on: 12/03/2018 11:42 AM   Modules accepted: Orders

## 2018-12-03 NOTE — Progress Notes (Signed)
New Patient Note  RE: Julia Flynn MRN: [id number] DOB: 01-May-1971 Date of Office Visit: 12/03/2018  Referring provider: No ref. provider found       Primary care provider: Jomarie Longs, PA-C  Chief Complaint: allergies  History of present illness: Julia Flynn is a 48 y.o. female presenting today for evaluation of allergies.    She reports symptoms of sneezing, itchy eyes, nasal congestion/drainage with PND.  Spring and fall are worse seasons for her but also symptomatic during winter.  She takes claritin daily now for the about 20 years.   About 20 years ago she used rhinocort but has been avoiding nose sprays as she states around the same time her mother was using Rhinocort and she lost her sense of smell.   She has used over-the-counter rewetting drops like Visine.  She had allergy testing about 20 years ago and recalls being positive to dust mites, pollens, animal dander.   She developed asthma during her 2nd pregnancy but states that "went away" after delivery and she has not had any issues since.   She also reports history of eczema.  She used to use triamcinolone but she is trying to avoid steroids.  She has used vanicream, CeraVe or Eucerin for eczema for moisturization.  She will develop an abdominal pain if she eats "rancid" PB that she avoids peanut products.  She also avoids shellfish as her hands get itchy if she handles it and she also develops diarrhea.  She has been avoiding these foods for past 25 years.    Review of systems: Review of Systems  Constitutional: Negative for chills, fever and malaise/fatigue.  HENT: Positive for congestion. Negative for ear discharge, ear pain, nosebleeds, sinus pain and sore throat.   Eyes: Negative for pain, discharge and redness.  Respiratory: Negative for cough, shortness of breath and wheezing.   Cardiovascular: Negative for chest pain.  Gastrointestinal: Negative for abdominal pain, constipation, diarrhea, heartburn,  nausea and vomiting.  Musculoskeletal: Negative for joint pain.  Skin: Negative for itching and rash.  Neurological: Negative for headaches.    All other systems negative unless noted above in HPI  Past medical history: Past Medical History:  Diagnosis Date  . Abnormal mammogram 07/15/2015   BIRADS-05/25/2015, following every 6 months to follow califications  . H/O Hashimoto thyroiditis 07/15/2015  . Hypothyroidism 07/15/2015  . Thyroid disease   . Well woman exam 07/15/2015   Pap NILM, HPV neg 2014    Past surgical history: Past Surgical History:  Procedure Laterality Date  . ADENOIDECTOMY  1981  . CESAREAN SECTION N/A 1999-2001-2007-2011    Family history:  Family History  Problem Relation Age of Onset  . Allergic rhinitis Son   . Asthma Neg Hx   . Eczema Neg Hx   . Urticaria Neg Hx   . Immunodeficiency Neg Hx   . Angioedema Neg Hx     Social history: She lives in a home without carpeting with gas heating and central cooling.  There are no pets in the home but there are turkeys, cats and deer outside the home.  There is no concern for water damage, mildew or roaches in the home.  She denies a smoking history.  Medication List: Allergies as of 12/03/2018      Reactions   Morphine Itching      Medication List       Accurate as of December 03, 2018 11:11 AM. Always use your most recent med list.  acetaminophen 325 MG tablet Commonly known as:  TYLENOL Take 650 mg by mouth every 6 (six) hours as needed.   ibuprofen 200 MG tablet Commonly known as:  ADVIL,MOTRIN Take 200 mg by mouth every 6 (six) hours as needed.   levothyroxine 100 MCG tablet Commonly known as:  SYNTHROID, LEVOTHROID Take by mouth.   Loratadine 10 MG Caps Take 10 mg by mouth.   triamcinolone cream 0.1 % Commonly known as:  KENALOG Apply 1 application topically 2 (two) times daily.       Known medication allergies: Allergies  Allergen Reactions  . Morphine Itching      Physical examination: Blood pressure (!) 88/54, pulse 64, temperature 98 F (36.7 C), temperature source Oral, resp. rate 16, height 5\' 8"  (1.727 m), weight 167 lb 6.4 oz (75.9 kg), SpO2 96 %.  General: Alert, interactive, in no acute distress. HEENT: PERRLA, TMs pearly gray, turbinates mildly edematous without discharge, post-pharynx non erythematous. Neck: Supple without lymphadenopathy. Lungs: Clear to auscultation without wheezing, rhonchi or rales. {no increased work of breathing. CV: Normal S1, S2 without murmurs. Abdomen: Nondistended, nontender. Skin: Warm and dry, without lesions or rashes. Extremities:  No clubbing, cyanosis or edema. Neuro:   Grossly intact.  Diagnositics/Labs:  Allergy testing: Environmental allergy skin prick testing is positive to grass pollens, weed pollens, oak tree pollen, Alternaria, dust mites, cat, mouse. Intradermal testing is positive to mold mix 2 in the mold mix 4 and dog Allergy testing results were read and interpreted by provider, documented by clinical staff.   Assessment and plan:   Allergic rhinitis with conjunctivitis  -Environmental allergy skin prick testing today is positive to weed pollens, weed pollens, tree pollen, molds, dust mites, cat, dog, mouse.  -Allergen avoidance measures discussed/handouts provided  -Resume Claritin 10 mg 1 tablet daily.   Can also try other long-acting antihistamines like Zyrtec 10mg , Allegra 180mg  or Xyzal 5mg  daily.    -For itchy/watery/red eyes use over-the-counter Pataday 1 drop each eye daily as needed  -Recommend use of saline rinse to help flush and clean out the sinuses  -Allergen immunotherapy discussed today including protocol, benefits and risk.  Informational handout provided.  If interested in this therapuetic option you can check with your insurance carrier for coverage.  Will prescribe with epinephrine device (Epipen or AuviQ) to bring on days of your injections.   Eczema  -Continue daily  moisturization with emollients like Vanicream, Eucerin, CeraVe  -trial use of Eucrisa, non-steroidal agent, on areas of eczema flares (red/patchy/itchy/dry areas) 1-2 times a day until improved.  Pam Drown can be used on the face and body if needed.    Adverse food reaction  -Select food allergy skin testing is negative to peanut and shellfish panel.  At this time patient is not interested in serum IgE testing.   At this time will elect to continue avoidance of these foods.    -Have access to self-injectable epinephrine (Epipen or AuviQ) 0.3mg  at all times  -Follow emergency action plan in case of allergic reaction  Follow-up in 4 to 6 months or sooner if needed  I appreciate the opportunity to take part in Julia Flynn's care. Please do not hesitate to contact me with questions.  Sincerely,   Margo Aye, MD Allergy/Immunology [facility]

## 2018-12-04 ENCOUNTER — Other Ambulatory Visit: Payer: Self-pay | Admitting: Allergy

## 2018-12-04 ENCOUNTER — Other Ambulatory Visit: Payer: Self-pay

## 2018-12-04 MED ORDER — OLOPATADINE HCL 0.2 % OP SOLN: 1.0000 [drp] | Freq: Every day | OPHTHALMIC | 5 refills | Status: DC

## 2018-12-04 NOTE — Telephone Encounter (Signed)
RF for olopatadine 0.2% x 1 with 5 refills at [facility]

## 2018-12-05 NOTE — Addendum Note (Signed)
Addended by: Lorrin Mais on: 12/05/2018 12:03 PM   Modules accepted: Orders

## 2018-12-05 NOTE — Progress Notes (Signed)
VIALS EXP 12-05-2019 

## 2018-12-06 DIAGNOSIS — J301 Allergic rhinitis due to pollen: Secondary | ICD-10-CM | POA: Diagnosis not present

## 2018-12-10 ENCOUNTER — Telehealth: Payer: Self-pay

## 2018-12-10 DIAGNOSIS — J3089 Other allergic rhinitis: Secondary | ICD-10-CM | POA: Diagnosis not present

## 2018-12-10 NOTE — Telephone Encounter (Signed)
Insurance denied olopatadine 0.2%.  Formulary alternatives are azelastine or epinastine ophthalmic solution.   Please advise.

## 2018-12-11 MED ORDER — EPINASTINE HCL 0.05 % OP SOLN: OPHTHALMIC | 5 refills | Status: DC

## 2018-12-11 NOTE — Telephone Encounter (Signed)
Ok we can do epinastine 1 drop each eye twice a day as needed for watery/itchy/red eyes

## 2018-12-11 NOTE — Telephone Encounter (Signed)
Sent in epinastine.  One drop each eye twice a day as needed for watery, itchy, red eyes.

## 2018-12-24 ENCOUNTER — Ambulatory Visit: Payer: BLUE CROSS/BLUE SHIELD

## 2018-12-24 ENCOUNTER — Ambulatory Visit (INDEPENDENT_AMBULATORY_CARE_PROVIDER_SITE_OTHER): Payer: BLUE CROSS/BLUE SHIELD

## 2018-12-24 ENCOUNTER — Other Ambulatory Visit: Payer: Self-pay

## 2018-12-24 DIAGNOSIS — J309 Allergic rhinitis, unspecified: Secondary | ICD-10-CM

## 2018-12-31 ENCOUNTER — Ambulatory Visit (INDEPENDENT_AMBULATORY_CARE_PROVIDER_SITE_OTHER): Payer: BLUE CROSS/BLUE SHIELD

## 2018-12-31 DIAGNOSIS — J309 Allergic rhinitis, unspecified: Secondary | ICD-10-CM

## 2019-01-07 ENCOUNTER — Ambulatory Visit (INDEPENDENT_AMBULATORY_CARE_PROVIDER_SITE_OTHER): Payer: BLUE CROSS/BLUE SHIELD

## 2019-01-07 DIAGNOSIS — J309 Allergic rhinitis, unspecified: Secondary | ICD-10-CM | POA: Diagnosis not present

## 2019-01-14 ENCOUNTER — Ambulatory Visit (INDEPENDENT_AMBULATORY_CARE_PROVIDER_SITE_OTHER): Payer: BLUE CROSS/BLUE SHIELD

## 2019-01-14 DIAGNOSIS — J309 Allergic rhinitis, unspecified: Secondary | ICD-10-CM | POA: Diagnosis not present

## 2019-01-21 ENCOUNTER — Ambulatory Visit (INDEPENDENT_AMBULATORY_CARE_PROVIDER_SITE_OTHER): Payer: BLUE CROSS/BLUE SHIELD

## 2019-01-21 DIAGNOSIS — J309 Allergic rhinitis, unspecified: Secondary | ICD-10-CM

## 2019-01-28 ENCOUNTER — Ambulatory Visit (INDEPENDENT_AMBULATORY_CARE_PROVIDER_SITE_OTHER): Payer: BLUE CROSS/BLUE SHIELD

## 2019-01-28 DIAGNOSIS — J309 Allergic rhinitis, unspecified: Secondary | ICD-10-CM | POA: Diagnosis not present

## 2019-02-05 ENCOUNTER — Ambulatory Visit (INDEPENDENT_AMBULATORY_CARE_PROVIDER_SITE_OTHER): Payer: BLUE CROSS/BLUE SHIELD

## 2019-02-05 DIAGNOSIS — J309 Allergic rhinitis, unspecified: Secondary | ICD-10-CM

## 2019-02-11 ENCOUNTER — Ambulatory Visit (INDEPENDENT_AMBULATORY_CARE_PROVIDER_SITE_OTHER): Payer: BLUE CROSS/BLUE SHIELD

## 2019-02-11 DIAGNOSIS — J309 Allergic rhinitis, unspecified: Secondary | ICD-10-CM

## 2019-02-18 ENCOUNTER — Ambulatory Visit (INDEPENDENT_AMBULATORY_CARE_PROVIDER_SITE_OTHER): Payer: BLUE CROSS/BLUE SHIELD

## 2019-02-18 DIAGNOSIS — J309 Allergic rhinitis, unspecified: Secondary | ICD-10-CM

## 2019-02-25 ENCOUNTER — Ambulatory Visit (INDEPENDENT_AMBULATORY_CARE_PROVIDER_SITE_OTHER): Payer: BLUE CROSS/BLUE SHIELD

## 2019-02-25 DIAGNOSIS — J309 Allergic rhinitis, unspecified: Secondary | ICD-10-CM | POA: Diagnosis not present

## 2019-03-04 ENCOUNTER — Ambulatory Visit (INDEPENDENT_AMBULATORY_CARE_PROVIDER_SITE_OTHER): Payer: BLUE CROSS/BLUE SHIELD

## 2019-03-04 DIAGNOSIS — J309 Allergic rhinitis, unspecified: Secondary | ICD-10-CM

## 2019-03-11 ENCOUNTER — Ambulatory Visit (INDEPENDENT_AMBULATORY_CARE_PROVIDER_SITE_OTHER): Payer: BC Managed Care – PPO

## 2019-03-11 DIAGNOSIS — J309 Allergic rhinitis, unspecified: Secondary | ICD-10-CM | POA: Diagnosis not present

## 2019-03-13 ENCOUNTER — Other Ambulatory Visit: Payer: Self-pay

## 2019-03-13 ENCOUNTER — Encounter: Payer: Self-pay | Admitting: Allergy and Immunology

## 2019-03-13 ENCOUNTER — Ambulatory Visit (INDEPENDENT_AMBULATORY_CARE_PROVIDER_SITE_OTHER): Payer: BC Managed Care – PPO | Admitting: Allergy and Immunology

## 2019-03-13 VITALS — BP 102/60 | HR 68 | Temp 98.3°F | Resp 16

## 2019-03-13 DIAGNOSIS — H1013 Acute atopic conjunctivitis, bilateral: Secondary | ICD-10-CM

## 2019-03-13 DIAGNOSIS — J3089 Other allergic rhinitis: Secondary | ICD-10-CM

## 2019-03-13 DIAGNOSIS — J4530 Mild persistent asthma, uncomplicated: Secondary | ICD-10-CM

## 2019-03-13 DIAGNOSIS — J309 Allergic rhinitis, unspecified: Secondary | ICD-10-CM | POA: Insufficient documentation

## 2019-03-13 DIAGNOSIS — H1010 Acute atopic conjunctivitis, unspecified eye: Secondary | ICD-10-CM | POA: Insufficient documentation

## 2019-03-13 DIAGNOSIS — J4520 Mild intermittent asthma, uncomplicated: Secondary | ICD-10-CM | POA: Insufficient documentation

## 2019-03-13 MED ORDER — MONTELUKAST SODIUM 10 MG PO TABS: 10.0000 mg | ORAL_TABLET | Freq: Every day | ORAL | 5 refills | Status: DC

## 2019-03-13 MED ORDER — ALBUTEROL SULFATE HFA 108 (90 BASE) MCG/ACT IN AERS: 1.0000 | INHALATION_SPRAY | RESPIRATORY_TRACT | 5 refills | Status: DC | PRN

## 2019-03-13 MED ORDER — AZELASTINE HCL 0.15 % NA SOLN: 1.0000 | Freq: Two times a day (BID) | NASAL | 5 refills | Status: DC | PRN

## 2019-03-13 NOTE — Progress Notes (Signed)
Follow-up Note  RE: Julia Flynn MRN: [id number] DOB: 08/26/1971 Date of Office Visit: 03/13/2019  Primary care provider: Nolene EbbsBreeback, Jade L, PA-C Referring provider: Jomarie LongsBreeback, Jade L, PA-C  History of present illness: Julia Mutteraryn Banales is a 48 y.o. female with allergic rhinoconjunctivitis presenting today for a sick visit.  She was previously seen in this clinic for her initial evaluation by Dr. Delorse LekPadgett.  She was started on immunotherapy.  She feels that after the injections she began to experience more postnasal drainage.  She reports that she is a mouth breather which she believes is secondary to nasal congestion and habit at this point.  She does not use medicated nasal sprays because she reports that her mother "lost her sense of smell" while using Rhinocort AQ.   She states that approximately 2 weeks ago she began to experience chest tightness along with some shortness of breath.  This problem "comes and goes", however seems to be worse with exposure to pollen and/or dust.  She denies wheezing.  She was diagnosed with asthma during pregnancy with her second son in 2001.  She was prescribed albuterol HFA at that time which provided significant temporary relief.  Assessment and plan: Mild persistent asthma  A prescription has been provided for montelukast 10 mg daily at bedtime.  A prescription has been provided for albuterol HFA, 1 to 2 inhalations via spacer device every 4-6 hours if needed.  Subjective and objective measures of pulmonary function will be followed and the treatment plan will be adjusted accordingly.  Allergic rhinitis  Continue appropriate allergen avoidance measures and immunotherapy injections per protocol.  Montelukast has been prescribed (as above).  A prescription has been provided for azelastine nasal spray, 1-2 sprays per nostril 2 times daily as needed. Proper nasal spray technique has been discussed and demonstrated.  The patient has been assured that this  medication is not a nasal steroid spray similar to the one her mother was taking when she lost her sense of smell.  Nasal saline spray (i.e., Simply Saline) or nasal saline lavage (i.e., NeilMed) is recommended as needed and prior to medicated nasal sprays.  For thick post nasal drainage, add guaifenesin 1200 mg (Mucinex Maximum Strength)  twice daily as needed with adequate hydration as discussed.  Allergic conjunctivitis  Continue appropriate allergen avoidance measures and olopatadine eyedrops, 1 drop per eye daily if needed.  I have also recommended eye lubricant drops (i.e., Natural Tears) as needed.   Meds ordered this encounter  Medications  . montelukast (SINGULAIR) 10 MG tablet    Sig: Take 1 tablet (10 mg total) by mouth at bedtime.    Dispense:  30 tablet    Refill:  5  . albuterol (PROVENTIL HFA) 108 (90 Base) MCG/ACT inhaler    Sig: Inhale 1-2 puffs into the lungs every 4 (four) hours as needed for wheezing or shortness of breath.    Dispense:  18 g    Refill:  5  . Azelastine HCl 0.15 % SOLN    Sig: Place 1-2 sprays into both nostrils 2 (two) times daily as needed.    Dispense:  30 mL    Refill:  5    Diagnostics: Spirometry reveals an FVC of 3.79 L and an FEV1 of 2.91 L (90% predicted) with 40 mL postbronchodilator improvement.  This study was performed while the patient was asymptomatic.  Please see scanned spirometry results for details.    Physical examination: Blood pressure 102/60, pulse 68, temperature 98.3 F (36.8 C),  temperature source Oral, resp. rate 16, SpO2 97 %.  General: Alert, interactive, in no acute distress. HEENT: TMs pearly gray, turbinates edematous with clear discharge, post-pharynx moderately erythematous. Neck: Supple without lymphadenopathy. Lungs: Clear to auscultation without wheezing, rhonchi or rales. CV: Normal S1, S2 without murmurs. Skin: Warm and dry, without lesions or rashes.  The following portions of the patient's  history were reviewed and updated as appropriate: allergies, current medications, past family history, past medical history, past social history, past surgical history and problem list.  Allergies as of 03/13/2019      Reactions   Morphine Itching      Medication List       Accurate as of March 13, 2019  4:41 PM. If you have any questions, ask your nurse or doctor.        acetaminophen 325 MG tablet Commonly known as: TYLENOL Take 650 mg by mouth every 6 (six) hours as needed.   albuterol 108 (90 Base) MCG/ACT inhaler Commonly known as: Proventil HFA Inhale 1-2 puffs into the lungs every 4 (four) hours as needed for wheezing or shortness of breath. Started by: Edmonia Lynch, MD   Azelastine HCl 0.15 % Soln Place 1-2 sprays into both nostrils 2 (two) times daily as needed. Started by: Edmonia Lynch, MD   Epinastine HCl 0.05 % ophthalmic solution One drop each eye twice a day as needed for itchy, watery red eyes.   EPINEPHrine 0.3 mg/0.3 mL Soaj injection Commonly known as: Auvi-Q Inject 0.3 mLs (0.3 mg total) into the muscle as needed for anaphylaxis.   fexofenadine 180 MG tablet Commonly known as: ALLEGRA Take 90 mg by mouth every morning.   ibuprofen 200 MG tablet Commonly known as: ADVIL Take 200 mg by mouth every 6 (six) hours as needed.   levothyroxine 100 MCG tablet Commonly known as: SYNTHROID Take by mouth.   Loratadine 10 MG Caps Take 10 mg by mouth.   montelukast 10 MG tablet Commonly known as: SINGULAIR Take 1 tablet (10 mg total) by mouth at bedtime. Started by: Edmonia Lynch, MD   Olopatadine HCl 0.2 % Soln Commonly known as: Pataday Apply 1 drop to eye daily as needed.   Olopatadine HCl 0.2 % Soln Please specify directions, refills and quantity   triamcinolone cream 0.1 % Commonly known as: KENALOG Apply 1 application topically 2 (two) times daily.       Allergies  Allergen Reactions  . Morphine Itching   Review of systems:  Review of systems negative except as noted in HPI / PMHx or noted below: Constitutional: Negative.  HENT: Negative.   Eyes: Negative.  Respiratory: Negative.   Cardiovascular: Negative.  Gastrointestinal: Negative.  Genitourinary: Negative.  Musculoskeletal: Negative.  Neurological: Negative.  Endo/Heme/Allergies: Negative.  Cutaneous: Negative.  Past Medical History:  Diagnosis Date  . Abnormal mammogram 07/15/2015   BIRADS-05/25/2015, following every 6 months to follow califications  . H/O Hashimoto thyroiditis 07/15/2015  . Hypothyroidism 07/15/2015  . Thyroid disease   . Well woman exam 07/15/2015   Pap NILM, HPV neg 2014    Family History  Problem Relation Age of Onset  . Allergic rhinitis Son   . Asthma Neg Hx   . Eczema Neg Hx   . Urticaria Neg Hx   . Immunodeficiency Neg Hx   . Angioedema Neg Hx     Social History   Socioeconomic History  . Marital status: Married    Spouse name: Not on file  . Number of  children: Not on file  . Years of education: Not on file  . Highest education level: Not on file  Occupational History  . Not on file  Social Needs  . Financial resource strain: Not on file  . Food insecurity    Worry: Not on file    Inability: Not on file  . Transportation needs    Medical: Not on file    Non-medical: Not on file  Tobacco Use  . Smoking status: Never Smoker  . Smokeless tobacco: Never Used  Substance and Sexual Activity  . Alcohol use: Never    Alcohol/week: 0.0 standard drinks    Frequency: Never  . Drug use: Never  . Sexual activity: Not on file  Lifestyle  . Physical activity    Days per week: Not on file    Minutes per session: Not on file  . Stress: Not on file  Relationships  . Social Musicianconnections    Talks on phone: Not on file    Gets together: Not on file    Attends religious service: Not on file    Active member of club or organization: Not on file    Attends meetings of clubs or organizations: Not on file     Relationship status: Not on file  . Intimate partner violence    Fear of current or ex partner: Not on file    Emotionally abused: Not on file    Physically abused: Not on file    Forced sexual activity: Not on file  Other Topics Concern  . Not on file  Social History Narrative  . Not on file    I appreciate the opportunity to take part in Rubby's care. Please do not hesitate to contact me with questions.  Sincerely,   R. Jorene Guestarter Kalyse Meharg, MD

## 2019-03-13 NOTE — Patient Instructions (Addendum)
Mild persistent asthma  A prescription has been provided for montelukast 10 mg daily at bedtime.  A prescription has been provided for albuterol HFA, 1 to 2 inhalations via spacer device every 4-6 hours if needed.  Subjective and objective measures of pulmonary function will be followed and the treatment plan will be adjusted accordingly.  Allergic rhinitis  Continue appropriate allergen avoidance measures and immunotherapy injections per protocol.  Montelukast has been prescribed (as above).  A prescription has been provided for azelastine nasal spray, 1-2 sprays per nostril 2 times daily as needed. Proper nasal spray technique has been discussed and demonstrated.  The patient has been assured that this medication is not a nasal steroid spray similar to the one her mother was taking when she lost her sense of smell.  Nasal saline spray (i.e., Simply Saline) or nasal saline lavage (i.e., NeilMed) is recommended as needed and prior to medicated nasal sprays.  For thick post nasal drainage, add guaifenesin 1200 mg (Mucinex Maximum Strength)  twice daily as needed with adequate hydration as discussed.  Allergic conjunctivitis  Continue appropriate allergen avoidance measures and olopatadine eyedrops, 1 drop per eye daily if needed.  I have also recommended eye lubricant drops (i.e., Natural Tears) as needed.   Return in about 3 months (around 06/13/2019), or if symptoms worsen or fail to improve.

## 2019-03-13 NOTE — Assessment & Plan Note (Signed)
   Continue appropriate allergen avoidance measures and olopatadine eyedrops, 1 drop per eye daily if needed.  I have also recommended eye lubricant drops (i.e., Natural Tears) as needed.

## 2019-03-13 NOTE — Assessment & Plan Note (Addendum)
   Continue appropriate allergen avoidance measures and immunotherapy injections per protocol.  Montelukast has been prescribed (as above).  A prescription has been provided for azelastine nasal spray, 1-2 sprays per nostril 2 times daily as needed. Proper nasal spray technique has been discussed and demonstrated.  The patient has been assured that this medication is not a nasal steroid spray similar to the one her mother was taking when she lost her sense of smell.  Nasal saline spray (i.e., Simply Saline) or nasal saline lavage (i.e., NeilMed) is recommended as needed and prior to medicated nasal sprays.  For thick post nasal drainage, add guaifenesin 1200 mg (Mucinex Maximum Strength)  twice daily as needed with adequate hydration as discussed.

## 2019-03-13 NOTE — Assessment & Plan Note (Signed)
   A prescription has been provided for montelukast 10 mg daily at bedtime.  A prescription has been provided for albuterol HFA, 1 to 2 inhalations via spacer device every 4-6 hours if needed.  Subjective and objective measures of pulmonary function will be followed and the treatment plan will be adjusted accordingly.

## 2019-03-14 MED ORDER — AZELASTINE HCL 0.15 % NA SOLN: 1.0000 | Freq: Two times a day (BID) | NASAL | 3 refills | Status: DC | PRN

## 2019-03-14 MED ORDER — MONTELUKAST SODIUM 10 MG PO TABS: 10.0000 mg | ORAL_TABLET | Freq: Every day | ORAL | 3 refills | Status: DC

## 2019-03-14 MED ORDER — ALBUTEROL SULFATE HFA 108 (90 BASE) MCG/ACT IN AERS: 1.0000 | INHALATION_SPRAY | RESPIRATORY_TRACT | 3 refills | Status: DC | PRN

## 2019-03-14 NOTE — Addendum Note (Signed)
Addended by: Katherina Right D on: 03/14/2019 11:11 AM   Modules accepted: Orders

## 2019-03-18 ENCOUNTER — Ambulatory Visit (INDEPENDENT_AMBULATORY_CARE_PROVIDER_SITE_OTHER): Payer: BC Managed Care – PPO

## 2019-03-18 DIAGNOSIS — J309 Allergic rhinitis, unspecified: Secondary | ICD-10-CM

## 2019-03-25 ENCOUNTER — Ambulatory Visit (INDEPENDENT_AMBULATORY_CARE_PROVIDER_SITE_OTHER): Payer: BC Managed Care – PPO

## 2019-03-25 DIAGNOSIS — J309 Allergic rhinitis, unspecified: Secondary | ICD-10-CM | POA: Diagnosis not present

## 2019-04-01 ENCOUNTER — Ambulatory Visit (INDEPENDENT_AMBULATORY_CARE_PROVIDER_SITE_OTHER): Payer: BC Managed Care – PPO

## 2019-04-01 DIAGNOSIS — J309 Allergic rhinitis, unspecified: Secondary | ICD-10-CM | POA: Diagnosis not present

## 2019-04-08 ENCOUNTER — Ambulatory Visit (INDEPENDENT_AMBULATORY_CARE_PROVIDER_SITE_OTHER): Payer: BC Managed Care – PPO

## 2019-04-08 DIAGNOSIS — J309 Allergic rhinitis, unspecified: Secondary | ICD-10-CM

## 2019-04-15 ENCOUNTER — Ambulatory Visit (INDEPENDENT_AMBULATORY_CARE_PROVIDER_SITE_OTHER): Payer: BC Managed Care – PPO

## 2019-04-15 DIAGNOSIS — J309 Allergic rhinitis, unspecified: Secondary | ICD-10-CM

## 2019-04-16 ENCOUNTER — Telehealth: Payer: Self-pay | Admitting: *Deleted

## 2019-04-16 ENCOUNTER — Telehealth: Payer: Self-pay

## 2019-04-16 NOTE — Telephone Encounter (Signed)
Dear Dr. Verlin Fester,  Julia Flynn and I came to the office yesterday for our allergy shots, and when Julia Flynn was given his, I noticed that the vial color was not what I had calculated it should be (I keep track of our progress on my calendar). When I asked, the nurses researched and told me that he had repeated the blue vial, but there were no notes as to why. We have come every week, he has had no reactions, and repeating a vial was never mentioned. I understand that mistakes are sometimes made, and I don't want to get anyone in trouble, but I am concerned to have lost 6 weeks, as we've already met our deductible for the year and were trying to complete buildup and get close to monthly injections by the end of the year. May we come twice a week for a while to make up the lost time? And, it seems that we (and our insurance co) should not be charged for the additional 6 weeks of injections, if indeed they were repeated needlessly. Thank you for your help in sorting this out. Sincerely, Julia Flynn did pull me to look over the flow sheets with her on this pt. An yes he did repeat the vial I am not sure why nothing was documented on if he had a reaction or not. It was a mystery as to why he repeated the blue vial. I explained to pt and his mother that I did not see a reason for his repeat of the blue vial and asked and confirmed he had no issues. Mom stated he had none what so ever. [name] and I were stumped.   Please see logans other telephone visit it looks like she resolved the issue but wanted to make you aware.

## 2019-04-16 NOTE — Telephone Encounter (Signed)
Spoke with mother regarding Lurena Joiner we found out what the issue was with his vials and it was explained to her that Lurena Joiner started on silver but it was documented wrong in the flow sheet as blue (so it looks like he went through 2 blue vials).

## 2019-04-16 NOTE — Telephone Encounter (Signed)
I spoke to Mom, she does understand. And she thanked [place] for following up. She also complemented [name] on her skills in the injection room. Johany stated that her son feels very much at ease with [name] and that [place] really made a difference in his experience.

## 2019-04-22 ENCOUNTER — Ambulatory Visit (INDEPENDENT_AMBULATORY_CARE_PROVIDER_SITE_OTHER): Payer: BC Managed Care – PPO

## 2019-04-22 DIAGNOSIS — J309 Allergic rhinitis, unspecified: Secondary | ICD-10-CM | POA: Diagnosis not present

## 2019-04-29 ENCOUNTER — Ambulatory Visit (INDEPENDENT_AMBULATORY_CARE_PROVIDER_SITE_OTHER): Payer: BC Managed Care – PPO

## 2019-04-29 DIAGNOSIS — J309 Allergic rhinitis, unspecified: Secondary | ICD-10-CM

## 2019-05-06 ENCOUNTER — Ambulatory Visit (INDEPENDENT_AMBULATORY_CARE_PROVIDER_SITE_OTHER): Payer: BC Managed Care – PPO

## 2019-05-06 DIAGNOSIS — J309 Allergic rhinitis, unspecified: Secondary | ICD-10-CM

## 2019-05-13 ENCOUNTER — Ambulatory Visit (INDEPENDENT_AMBULATORY_CARE_PROVIDER_SITE_OTHER): Payer: BC Managed Care – PPO

## 2019-05-13 DIAGNOSIS — Z1239 Encounter for other screening for malignant neoplasm of breast: Secondary | ICD-10-CM | POA: Diagnosis not present

## 2019-05-13 DIAGNOSIS — Z1231 Encounter for screening mammogram for malignant neoplasm of breast: Secondary | ICD-10-CM | POA: Diagnosis not present

## 2019-05-13 DIAGNOSIS — J309 Allergic rhinitis, unspecified: Secondary | ICD-10-CM | POA: Diagnosis not present

## 2019-05-16 ENCOUNTER — Other Ambulatory Visit: Payer: Self-pay

## 2019-05-16 ENCOUNTER — Ambulatory Visit (INDEPENDENT_AMBULATORY_CARE_PROVIDER_SITE_OTHER): Payer: BC Managed Care – PPO | Admitting: Physician Assistant

## 2019-05-16 ENCOUNTER — Encounter: Payer: Self-pay | Admitting: Physician Assistant

## 2019-05-16 VITALS — BP 107/64 | HR 65 | Temp 99.2°F | Ht 68.0 in | Wt 165.0 lb

## 2019-05-16 DIAGNOSIS — Z0000 Encounter for general adult medical examination without abnormal findings: Secondary | ICD-10-CM | POA: Diagnosis not present

## 2019-05-16 DIAGNOSIS — Z13220 Encounter for screening for lipoid disorders: Secondary | ICD-10-CM

## 2019-05-16 DIAGNOSIS — Z131 Encounter for screening for diabetes mellitus: Secondary | ICD-10-CM | POA: Diagnosis not present

## 2019-05-16 LAB — COMPLETE METABOLIC PANEL WITH GFR
AG Ratio: 1.8 (calc) (ref 1.0–2.5)
ALT: 6 U/L (ref 6–29)
AST: 12 U/L (ref 10–35)
Albumin: 4.4 g/dL (ref 3.6–5.1)
Alkaline phosphatase (APISO): 32 U/L (ref 31–125)
BUN: 15 mg/dL (ref 7–25)
CO2: 26 mmol/L (ref 20–32)
Calcium: 8.9 mg/dL (ref 8.6–10.2)
Chloride: 108 mmol/L (ref 98–110)
Creat: 0.75 mg/dL (ref 0.50–1.10)
GFR, Est African American: 109 mL/min/{1.73_m2} (ref >=60)
GFR, Est Non African American: 94 mL/min/{1.73_m2} (ref >=60)
Globulin: 2.4 g/dL (calc) (ref 1.9–3.7)
Glucose, Bld: 92 mg/dL (ref 65–99)
Potassium: 4.1 mmol/L (ref 3.5–5.3)
Sodium: 141 mmol/L (ref 135–146)
Total Bilirubin: 0.4 mg/dL (ref 0.2–1.2)
Total Protein: 6.8 g/dL (ref 6.1–8.1)

## 2019-05-16 LAB — LIPID PANEL W/REFLEX DIRECT LDL
Cholesterol: 197 mg/dL (ref <200)
HDL: 41 mg/dL — ABNORMAL LOW (ref >=50)
LDL Cholesterol (Calc): 132 mg/dL (calc) — ABNORMAL HIGH
Non-HDL Cholesterol (Calc): 156 mg/dL (calc) — ABNORMAL HIGH (ref <130)
Total CHOL/HDL Ratio: 4.8 (calc) (ref <5.0)
Triglycerides: 125 mg/dL (ref <150)

## 2019-05-16 NOTE — Patient Instructions (Signed)
Diet for Irritable Bowel Syndrome When you have irritable bowel syndrome (IBS), it is very important to eat the foods and follow the eating habits that are best for your condition. IBS may cause various symptoms such as pain in the abdomen, constipation, or diarrhea. Choosing the right foods can help to ease the discomfort from these symptoms. Work with your health care provider and diet and nutrition specialist (dietitian) to find the eating plan that will help to control your symptoms. What are tips for following this plan?      Keep a food diary. This will help you identify foods that cause symptoms. Write down: ? What you eat and when you eat it. ? What symptoms you have. ? When symptoms occur in relation to your meals, such as "pain in abdomen 2 hours after dinner."  Eat your meals slowly and in a relaxed setting.  Aim to eat 5-6 small meals per day. Do not skip meals.  Drink enough fluid to keep your urine pale yellow.  Ask your health care provider if you should take an over-the-counter probiotic to help restore healthy bacteria in your gut (digestive tract). ? Probiotics are foods that contain good bacteria and yeasts.  Your dietitian may have specific dietary recommendations for you based on your symptoms. He or she may recommend that you: ? Avoid foods that cause symptoms. Talk with your dietitian about other ways to get the same nutrients that are in those problem foods. ? Avoid foods with gluten. Gluten is a protein that is found in rye, wheat, and barley. ? Eat more foods that contain soluble fiber. Examples of foods with high soluble fiber include oats, seeds, and certain fruits and vegetables. Take a fiber supplement if directed by your dietitian. ? Reduce or avoid certain foods called FODMAPs. These are foods that contain carbohydrates that are hard to digest. Ask your doctor which foods contain these carbohydrates. What foods are not recommended? The following are some  foods and drinks that may make your symptoms worse:  Fatty foods, such as french fries.  Foods that contain gluten, such as pasta and cereal.  Dairy products, such as milk, cheese, and ice cream.  Chocolate.  Alcohol.  Products with caffeine, such as coffee.  Carbonated drinks, such as soda.  Foods that are high in FODMAPs. These include certain fruits and vegetables.  Products with sweeteners such as honey, high fructose corn syrup, sorbitol, and mannitol. The items listed above may not be a complete list of foods and beverages you should avoid. Contact a dietitian for more information. What foods are good sources of fiber? Your health care provider or dietitian may recommend that you eat more foods that contain fiber. Fiber can help to reduce constipation and other IBS symptoms. Add foods with fiber to your diet a little at a time so your body can get used to them. Too much fiber at one time might cause gas and swelling of your abdomen. The following are some foods that are good sources of fiber:  Berries, such as raspberries, strawberries, and blueberries.  Tomatoes.  Carrots.  Brown rice.  Oats.  Seeds, such as chia and pumpkin seeds. The items listed above may not be a complete list of recommended sources of fiber. Contact your dietitian for more options. Where to find more information  International Foundation for Functional Gastrointestinal Disorders: www.iffgd.org  National Institute of Diabetes and Digestive and Kidney Diseases: www.niddk.nih.gov Summary  When you have irritable bowel syndrome (IBS), it is   very important to eat the foods and follow the eating habits that are best for your condition.  IBS may cause various symptoms such as pain in the abdomen, constipation, or diarrhea.  Choosing the right foods can help to ease the discomfort that comes from symptoms.  Keep a food diary. This will help you identify foods that cause symptoms.  Your health  care provider or diet and nutrition specialist (dietitian) may recommend that you eat more foods that contain fiber. This information is not intended to replace advice given to you by your health care provider. Make sure you discuss any questions you have with your health care provider. Document Released: 11/19/2003 Document Revised: 12/19/2018 Document Reviewed: 05/02/2017 Elsevier Patient Education  2020 Elsevier Inc. Health Maintenance, Female Adopting a healthy lifestyle and getting preventive care are important in promoting health and wellness. Ask your health care provider about:  The right schedule for you to have regular tests and exams.  Things you can do on your own to prevent diseases and keep yourself healthy. What should I know about diet, weight, and exercise? Eat a healthy diet   Eat a diet that includes plenty of vegetables, fruits, low-fat dairy products, and lean protein.  Do not eat a lot of foods that are high in solid fats, added sugars, or sodium. Maintain a healthy weight Body mass index (BMI) is used to identify weight problems. It estimates body fat based on height and weight. Your health care provider can help determine your BMI and help you achieve or maintain a healthy weight. Get regular exercise Get regular exercise. This is one of the most important things you can do for your health. Most adults should:  Exercise for at least 150 minutes each week. The exercise should increase your heart rate and make you sweat (moderate-intensity exercise).  Do strengthening exercises at least twice a week. This is in addition to the moderate-intensity exercise.  Spend less time sitting. Even light physical activity can be beneficial. Watch cholesterol and blood lipids Have your blood tested for lipids and cholesterol at 49 years of age, then have this test every 5 years. Have your cholesterol levels checked more often if:  Your lipid or cholesterol levels are high.   You are older than 49 years of age.  You are at high risk for heart disease. What should I know about cancer screening? Depending on your health history and family history, you may need to have cancer screening at various ages. This may include screening for:  Breast cancer.  Cervical cancer.  Colorectal cancer.  Skin cancer.  Lung cancer. What should I know about heart disease, diabetes, and high blood pressure? Blood pressure and heart disease  High blood pressure causes heart disease and increases the risk of stroke. This is more likely to develop in people who have high blood pressure readings, are of African descent, or are overweight.  Have your blood pressure checked: ? Every 3-5 years if you are 18-39 years of age. ? Every year if you are 40 years old or older. Diabetes Have regular diabetes screenings. This checks your fasting blood sugar level. Have the screening done:  Once every three years after age 40 if you are at a normal weight and have a low risk for diabetes.  More often and at a younger age if you are overweight or have a high risk for diabetes. What should I know about preventing infection? Hepatitis B If you have a higher risk for hepatitis   B, you should be screened for this virus. Talk with your health care provider to find out if you are at risk for hepatitis B infection. Hepatitis C Testing is recommended for:  Everyone born from 1945 through 1965.  Anyone with known risk factors for hepatitis C. Sexually transmitted infections (STIs)  Get screened for STIs, including gonorrhea and chlamydia, if: ? You are sexually active and are younger than 49 years of age. ? You are older than 49 years of age and your health care provider tells you that you are at risk for this type of infection. ? Your sexual activity has changed since you were last screened, and you are at increased risk for chlamydia or gonorrhea. Ask your health care provider if you are at  risk.  Ask your health care provider about whether you are at high risk for HIV. Your health care provider may recommend a prescription medicine to help prevent HIV infection. If you choose to take medicine to prevent HIV, you should first get tested for HIV. You should then be tested every 3 months for as long as you are taking the medicine. Pregnancy  If you are about to stop having your period (premenopausal) and you may become pregnant, seek counseling before you get pregnant.  Take 400 to 800 micrograms (mcg) of folic acid every day if you become pregnant.  Ask for birth control (contraception) if you want to prevent pregnancy. Osteoporosis and menopause Osteoporosis is a disease in which the bones lose minerals and strength with aging. This can result in bone fractures. If you are 65 years old or older, or if you are at risk for osteoporosis and fractures, ask your health care provider if you should:  Be screened for bone loss.  Take a calcium or vitamin D supplement to lower your risk of fractures.  Be given hormone replacement therapy (HRT) to treat symptoms of menopause. Follow these instructions at home: Lifestyle  Do not use any products that contain nicotine or tobacco, such as cigarettes, e-cigarettes, and chewing tobacco. If you need help quitting, ask your health care provider.  Do not use street drugs.  Do not share needles.  Ask your health care provider for help if you need support or information about quitting drugs. Alcohol use  Do not drink alcohol if: ? Your health care provider tells you not to drink. ? You are pregnant, may be pregnant, or are planning to become pregnant.  If you drink alcohol: ? Limit how much you use to 0-1 drink a day. ? Limit intake if you are breastfeeding.  Be aware of how much alcohol is in your drink. In the [place] one drink equals one 12 oz bottle of beer (355 mL), one 5 oz glass of wine (148 mL), or one 1 oz glass of hard liquor  (44 mL). General instructions  Schedule regular health, dental, and eye exams.  Stay current with your vaccines.  Tell your health care provider if: ? You often feel depressed. ? You have ever been abused or do not feel safe at home. Summary  Adopting a healthy lifestyle and getting preventive care are important in promoting health and wellness.  Follow your health care provider's instructions about healthy diet, exercising, and getting tested or screened for diseases.  Follow your health care provider's instructions on monitoring your cholesterol and blood pressure. This information is not intended to replace advice given to you by your health care provider. Make sure you discuss any questions you   have with your health care provider. Document Released: 03/14/2011 Document Revised: 08/22/2018 Document Reviewed: 08/22/2018 Elsevier Patient Education  2020 Elsevier Inc.  

## 2019-05-16 NOTE — Progress Notes (Signed)
Subjective:     Julia Flynn is a 48 y.o. female and is here for a comprehensive physical exam. The patient reports problems - pt does have some intermittent diarrhea to certain things she eats. after a bowel movement no abdominal pain. seems to be trigger by leafy greens. .  Social History   Socioeconomic History  . Marital status: Married    Spouse name: Not on file  . Number of children: Not on file  . Years of education: Not on file  . Highest education level: Not on file  Occupational History  . Not on file  Social Needs  . Financial resource strain: Not on file  . Food insecurity    Worry: Not on file    Inability: Not on file  . Transportation needs    Medical: Not on file    Non-medical: Not on file  Tobacco Use  . Smoking status: Never Smoker  . Smokeless tobacco: Never Used  Substance and Sexual Activity  . Alcohol use: Never    Alcohol/week: 0.0 standard drinks    Frequency: Never  . Drug use: Never  . Sexual activity: Not on file  Lifestyle  . Physical activity    Days per week: Not on file    Minutes per session: Not on file  . Stress: Not on file  Relationships  . Social Herbalist on phone: Not on file    Gets together: Not on file    Attends religious service: Not on file    Active member of club or organization: Not on file    Attends meetings of clubs or organizations: Not on file    Relationship status: Not on file  . Intimate partner violence    Fear of current or ex partner: Not on file    Emotionally abused: Not on file    Physically abused: Not on file    Forced sexual activity: Not on file  Other Topics Concern  . Not on file  Social History Narrative  . Not on file   Health Maintenance  Topic Date Due  . PAP SMEAR-Modifier  01/11/2016  . INFLUENZA VACCINE  12/11/2019 (Originally 04/13/2019)  . MAMMOGRAM  05/12/2020  . TETANUS/TDAP  01/30/2023  . HIV Screening  Completed    The following portions of the patient's history  were reviewed and updated as appropriate: allergies, current medications, past family history, past medical history, past social history, past surgical history and problem list.  Review of Systems Pertinent items noted in HPI and remainder of comprehensive ROS otherwise negative.   Objective:    BP 107/64   Pulse 65   Temp 99.2 F (37.3 C) (Oral)   Ht 5\' 8"  (1.727 m)   Wt 165 lb (74.8 kg)   LMP 05/16/2019 (Exact Date)   SpO2 99%   BMI 25.09 kg/m  General appearance: alert, cooperative and appears stated age Head: Normocephalic, without obvious abnormality, atraumatic Eyes: conjunctivae/corneas clear. PERRL, EOM's intact. Fundi benign. Ears: normal TM's and external ear canals both ears Nose: Nares normal. Septum midline. Mucosa normal. No drainage or sinus tenderness. Throat: lips, mucosa, and tongue normal; teeth and gums normal Neck: no adenopathy, no carotid bruit, no JVD, supple, symmetrical, trachea midline and thyroid not enlarged, symmetric, no tenderness/mass/nodules Back: symmetric, no curvature. ROM normal. No CVA tenderness. Lungs: clear to auscultation bilaterally Heart: regular rate and rhythm, S1, S2 normal, no murmur, click, rub or gallop Abdomen: soft, non-tender; bowel sounds normal; no masses,  no organomegaly Extremities: extremities normal, atraumatic, no cyanosis or edema Pulses: 2+ and symmetric Skin: Skin color, texture, turgor normal. No rashes or lesions Lymph nodes: Cervical, supraclavicular, and axillary nodes normal. Neurologic: Alert and oriented X 3, normal strength and tone. Normal symmetric reflexes. Normal coordination and gait    Assessment:    Healthy female exam.      Plan:    Marland Kitchen.Marland Kitchen.Julia Flynn was seen today for annual exam.  Diagnoses and all orders for this visit:  Routine physical examination -     Lipid Panel w/reflex Direct LDL -     COMPLETE METABOLIC PANEL WITH GFR  Screening for lipid disorders -     Lipid Panel w/reflex Direct  LDL  Screening for diabetes mellitus -     COMPLETE METABOLIC PANEL WITH GFR   .Marland Kitchen. [facility]  Decreased Interest 0  Down, Depressed, Hopeless 0  PHQ - 2 Score 0  Altered sleeping 0  Tired, decreased energy 0  Change in appetite 0  Feeling bad or failure about yourself  0  Trouble concentrating 0  Moving slowly or fidgety/restless 0  Suicidal thoughts 0  PHQ-9 Score 0  Difficult doing work/chores Not difficult at all   .Marland Kitchen. Discussed 150 minutes of exercise a week.  Encouraged vitamin D 1000 units and Calcium 1300mg  or 4 servings of dairy a day.  Fasting labs ordered.  TSH managed by endocrinology.  Mammogram done on Monday.  Pap declined today. Will call to reschedule. She started her period.  Flu shot declined.   Discussed IBS. Encouraged to start probiotic. Follow up as needed.  See After Visit Summary for Counseling Recommendations

## 2019-05-17 ENCOUNTER — Encounter: Payer: Self-pay | Admitting: Physician Assistant

## 2019-05-17 DIAGNOSIS — E785 Hyperlipidemia, unspecified: Secondary | ICD-10-CM | POA: Insufficient documentation

## 2019-05-17 NOTE — Progress Notes (Signed)
Call pt: kidney, liver, glucose look great.  Cholesterol is a little elevated. LDL 132 goal under 100 and HDL low at 41.  Keep exercising and watching processed foods/fried foods. Overall 10 year cardiovascular risk is still low. Will continue to monitor.

## 2019-05-21 ENCOUNTER — Ambulatory Visit (INDEPENDENT_AMBULATORY_CARE_PROVIDER_SITE_OTHER): Payer: BC Managed Care – PPO

## 2019-05-21 DIAGNOSIS — J309 Allergic rhinitis, unspecified: Secondary | ICD-10-CM | POA: Diagnosis not present

## 2019-05-27 ENCOUNTER — Ambulatory Visit (INDEPENDENT_AMBULATORY_CARE_PROVIDER_SITE_OTHER): Payer: BC Managed Care – PPO

## 2019-05-27 DIAGNOSIS — J309 Allergic rhinitis, unspecified: Secondary | ICD-10-CM | POA: Diagnosis not present

## 2019-06-03 ENCOUNTER — Ambulatory Visit (INDEPENDENT_AMBULATORY_CARE_PROVIDER_SITE_OTHER): Payer: BC Managed Care – PPO

## 2019-06-03 DIAGNOSIS — J309 Allergic rhinitis, unspecified: Secondary | ICD-10-CM | POA: Diagnosis not present

## 2019-06-10 ENCOUNTER — Ambulatory Visit (INDEPENDENT_AMBULATORY_CARE_PROVIDER_SITE_OTHER): Payer: BC Managed Care – PPO

## 2019-06-10 DIAGNOSIS — J309 Allergic rhinitis, unspecified: Secondary | ICD-10-CM

## 2019-06-14 ENCOUNTER — Ambulatory Visit: Payer: BLUE CROSS/BLUE SHIELD | Admitting: Allergy

## 2019-06-17 ENCOUNTER — Ambulatory Visit (INDEPENDENT_AMBULATORY_CARE_PROVIDER_SITE_OTHER): Payer: BC Managed Care – PPO

## 2019-06-17 DIAGNOSIS — J309 Allergic rhinitis, unspecified: Secondary | ICD-10-CM

## 2019-06-19 ENCOUNTER — Ambulatory Visit: Payer: BLUE CROSS/BLUE SHIELD | Admitting: Allergy and Immunology

## 2019-06-25 ENCOUNTER — Ambulatory Visit (INDEPENDENT_AMBULATORY_CARE_PROVIDER_SITE_OTHER): Payer: BC Managed Care – PPO

## 2019-06-25 DIAGNOSIS — J309 Allergic rhinitis, unspecified: Secondary | ICD-10-CM | POA: Diagnosis not present

## 2019-06-25 NOTE — Progress Notes (Signed)
EXP 06/26/20 

## 2019-06-28 DIAGNOSIS — E041 Nontoxic single thyroid nodule: Secondary | ICD-10-CM | POA: Diagnosis not present

## 2019-06-28 DIAGNOSIS — E038 Other specified hypothyroidism: Secondary | ICD-10-CM | POA: Diagnosis not present

## 2019-06-28 DIAGNOSIS — E063 Autoimmune thyroiditis: Secondary | ICD-10-CM | POA: Diagnosis not present

## 2019-07-01 ENCOUNTER — Ambulatory Visit (INDEPENDENT_AMBULATORY_CARE_PROVIDER_SITE_OTHER): Payer: BC Managed Care – PPO

## 2019-07-01 DIAGNOSIS — J309 Allergic rhinitis, unspecified: Secondary | ICD-10-CM | POA: Diagnosis not present

## 2019-07-02 DIAGNOSIS — E041 Nontoxic single thyroid nodule: Secondary | ICD-10-CM | POA: Diagnosis not present

## 2019-07-02 DIAGNOSIS — J301 Allergic rhinitis due to pollen: Secondary | ICD-10-CM | POA: Diagnosis not present

## 2019-07-08 ENCOUNTER — Ambulatory Visit (INDEPENDENT_AMBULATORY_CARE_PROVIDER_SITE_OTHER): Payer: BC Managed Care – PPO

## 2019-07-08 DIAGNOSIS — J309 Allergic rhinitis, unspecified: Secondary | ICD-10-CM | POA: Diagnosis not present

## 2019-07-17 ENCOUNTER — Ambulatory Visit (INDEPENDENT_AMBULATORY_CARE_PROVIDER_SITE_OTHER): Payer: BC Managed Care – PPO

## 2019-07-17 DIAGNOSIS — J309 Allergic rhinitis, unspecified: Secondary | ICD-10-CM | POA: Diagnosis not present

## 2019-07-22 ENCOUNTER — Ambulatory Visit (INDEPENDENT_AMBULATORY_CARE_PROVIDER_SITE_OTHER): Payer: BC Managed Care – PPO

## 2019-07-22 DIAGNOSIS — J309 Allergic rhinitis, unspecified: Secondary | ICD-10-CM | POA: Diagnosis not present

## 2019-07-29 ENCOUNTER — Ambulatory Visit (INDEPENDENT_AMBULATORY_CARE_PROVIDER_SITE_OTHER): Payer: BC Managed Care – PPO

## 2019-07-29 DIAGNOSIS — J309 Allergic rhinitis, unspecified: Secondary | ICD-10-CM | POA: Diagnosis not present

## 2019-08-05 ENCOUNTER — Ambulatory Visit (INDEPENDENT_AMBULATORY_CARE_PROVIDER_SITE_OTHER): Payer: BC Managed Care – PPO

## 2019-08-05 DIAGNOSIS — J309 Allergic rhinitis, unspecified: Secondary | ICD-10-CM

## 2019-08-12 ENCOUNTER — Ambulatory Visit (INDEPENDENT_AMBULATORY_CARE_PROVIDER_SITE_OTHER): Payer: BC Managed Care – PPO

## 2019-08-12 DIAGNOSIS — J309 Allergic rhinitis, unspecified: Secondary | ICD-10-CM

## 2019-08-19 ENCOUNTER — Ambulatory Visit (INDEPENDENT_AMBULATORY_CARE_PROVIDER_SITE_OTHER): Payer: BC Managed Care – PPO

## 2019-08-19 DIAGNOSIS — J309 Allergic rhinitis, unspecified: Secondary | ICD-10-CM | POA: Diagnosis not present

## 2019-08-26 ENCOUNTER — Ambulatory Visit (INDEPENDENT_AMBULATORY_CARE_PROVIDER_SITE_OTHER): Payer: BC Managed Care – PPO

## 2019-08-26 ENCOUNTER — Other Ambulatory Visit: Payer: Self-pay | Admitting: Allergy and Immunology

## 2019-08-26 DIAGNOSIS — J309 Allergic rhinitis, unspecified: Secondary | ICD-10-CM

## 2019-08-26 MED ORDER — EUCRISA 2 % EX OINT: 1.0000 "application " | TOPICAL_OINTMENT | Freq: Two times a day (BID) | CUTANEOUS | 0 refills | Status: DC | PRN

## 2019-08-26 NOTE — Telephone Encounter (Signed)
Pt in office to get a rx for [place]. Dr Nelva Bush gave her samples and she as ran out. Needs rx sent to [facility] which pt states does not usually cover this med. So will also need medical necessity letter or PA. Please call pt when rx has been sent in or if there are any issues.

## 2019-08-26 NOTE — Telephone Encounter (Signed)
Sent in 1 refill of Eucrisa to [place]. Pt needs ov for any additional refills. Will wait for a kick back from pharmacy to see if a PA is required.

## 2019-08-27 ENCOUNTER — Telehealth: Payer: Self-pay

## 2019-08-27 NOTE — Telephone Encounter (Signed)
pts insurance does not cover eucrisa 2% oint, pt must try clobetasol gel, triamcinolone 0.1% cream or halobetasol ointment. Please advise to the change.

## 2019-08-27 NOTE — Telephone Encounter (Signed)
It appears from her office visit note in July 2020, that she has already tried triamcinolone 0.1% cream. Thanks.

## 2019-08-27 NOTE — Telephone Encounter (Signed)
Patient is on scheduled for jan 20th at 4 pm with bobbitt

## 2019-08-27 NOTE — Telephone Encounter (Signed)
PA has been submitted thru cover my meds for eucrisa

## 2019-09-02 ENCOUNTER — Ambulatory Visit (INDEPENDENT_AMBULATORY_CARE_PROVIDER_SITE_OTHER): Payer: BC Managed Care – PPO

## 2019-09-02 DIAGNOSIS — J309 Allergic rhinitis, unspecified: Secondary | ICD-10-CM

## 2019-09-03 NOTE — Telephone Encounter (Signed)
Patient called and wanted to know about the [place] PA, per Morey Hummingbird is had been canceled. Will resend it. Also wanted [place] to send in levocetirizine for her son Lurena Joiner. Rx sent in

## 2019-09-03 NOTE — Telephone Encounter (Signed)
PT called says PA has not been received per cover my meds, needs to be resent.

## 2019-09-09 ENCOUNTER — Ambulatory Visit (INDEPENDENT_AMBULATORY_CARE_PROVIDER_SITE_OTHER): Payer: BC Managed Care – PPO

## 2019-09-09 DIAGNOSIS — J309 Allergic rhinitis, unspecified: Secondary | ICD-10-CM

## 2019-09-10 ENCOUNTER — Telehealth: Payer: Self-pay

## 2019-09-10 NOTE — Telephone Encounter (Signed)
Prior authorization for Eucrisa has been approved.   PA Case: [id number], Status: Approved, Coverage Starts on: 09/10/2019 12:00:00 AM, Coverage Ends on: 09/09/2020 12:00:00 AM. 

## 2019-09-12 ENCOUNTER — Telehealth: Payer: Self-pay

## 2019-09-12 NOTE — Telephone Encounter (Signed)
Received error message from [facility].  Eucrisa 2% ointment needs PA.  Submitted PA request to [facility] via Cover My Meds.    Medication NDC Prescription # DAW  EUCRISA 2% OINTMENT [id number] No  Written Date Last Fill Date Quantity Days Supply  08/26/2019 09/11/2019 60 g 15  Sig Notes  APPLY TO AFFECTED AREA TWICE A DAY AS NEEDED (NEED OFFICE VISIT FOR REFILL) Alternative Requested:NEEDS PA  Provider Information  Prescribing Provider License DEA #  Prudy Feeler  Not Available [id number]  Pharmacy Information  Pharmacy NCPDP ID Address Phone  [facility] [id number] [place] [address] [phone] (PrimaryTelephone

## 2019-09-12 NOTE — Telephone Encounter (Signed)
Prior authorization for Julia Flynn has been approved.   PA Case: [id number], Status: Approved, Coverage Starts on: 09/10/2019 12:00:00 AM, Coverage Ends on: 09/09/2020 12:00:00 AM.

## 2019-09-16 ENCOUNTER — Ambulatory Visit (INDEPENDENT_AMBULATORY_CARE_PROVIDER_SITE_OTHER): Payer: BC Managed Care – PPO

## 2019-09-16 DIAGNOSIS — J309 Allergic rhinitis, unspecified: Secondary | ICD-10-CM

## 2019-09-19 DIAGNOSIS — J301 Allergic rhinitis due to pollen: Secondary | ICD-10-CM | POA: Diagnosis not present

## 2019-09-19 NOTE — Progress Notes (Signed)
Vials exp 09-18-20 

## 2019-09-23 ENCOUNTER — Ambulatory Visit (INDEPENDENT_AMBULATORY_CARE_PROVIDER_SITE_OTHER): Payer: BC Managed Care – PPO

## 2019-09-23 DIAGNOSIS — J309 Allergic rhinitis, unspecified: Secondary | ICD-10-CM

## 2019-09-30 ENCOUNTER — Ambulatory Visit (INDEPENDENT_AMBULATORY_CARE_PROVIDER_SITE_OTHER): Payer: BC Managed Care – PPO

## 2019-09-30 DIAGNOSIS — J309 Allergic rhinitis, unspecified: Secondary | ICD-10-CM

## 2019-10-02 ENCOUNTER — Ambulatory Visit (INDEPENDENT_AMBULATORY_CARE_PROVIDER_SITE_OTHER): Payer: BC Managed Care – PPO | Admitting: Allergy and Immunology

## 2019-10-02 ENCOUNTER — Encounter: Payer: Self-pay | Admitting: Allergy and Immunology

## 2019-10-02 ENCOUNTER — Other Ambulatory Visit: Payer: Self-pay

## 2019-10-02 VITALS — BP 98/64 | HR 74 | Temp 97.9°F | Resp 18 | Ht 67.6 in | Wt 164.0 lb

## 2019-10-02 DIAGNOSIS — J4520 Mild intermittent asthma, uncomplicated: Secondary | ICD-10-CM | POA: Diagnosis not present

## 2019-10-02 DIAGNOSIS — H1013 Acute atopic conjunctivitis, bilateral: Secondary | ICD-10-CM | POA: Diagnosis not present

## 2019-10-02 DIAGNOSIS — J301 Allergic rhinitis due to pollen: Secondary | ICD-10-CM

## 2019-10-02 MED ORDER — LEVOCETIRIZINE DIHYDROCHLORIDE 5 MG PO TABS: 5.0000 mg | ORAL_TABLET | Freq: Every day | ORAL | 3 refills | Status: DC | PRN

## 2019-10-02 NOTE — Progress Notes (Signed)
Follow-up Note  RE: Julia Flynn MRN: [id number] DOB: 22-Nov-1970 Date of Office Visit: 10/02/2019  Primary care provider: Nolene Ebbs Referring provider: Jomarie Longs, PA-C  History of present illness: Julia Flynn is a 49 y.o. female with persistent asthma and allergic rhinoconjunctivitis presenting today for follow-up.  She was last seen in this clinic in July 2020.  She reports that she had to discontinue montelukast due to fatigue and headaches.  In addition, she discontinued azelastine nasal spray because it "immediately" gave her headaches.  She is not interested in starting nasal steroid spray because her mother became anosmic after a nasal steroid spray.  She is currently using cetirizine, however she notes that this medication causes her mouth to be very dry.  She continues to receive immunotherapy injections per protocol.  Assessment and plan: Allergic rhinitis  Continue appropriate allergen avoidance measures and immunotherapy injections per protocol.  Nasal saline spray (i.e., Simply Saline) or nasal saline lavage (i.e., NeilMed) is recommended as needed.  For thick post nasal drainage, add guaifenesin 1200 mg (Mucinex Maximum Strength)  twice daily as needed with adequate hydration as discussed.  Mild intermittent asthma Currently well controlled.  Montelukast discontinued due to perceived side effects.  Continue albuterol HFA, 1 to 2 inhalations every 4-6 hours as needed.  Subjective and objective measures of pulmonary function will be followed and the treatment plan will be adjusted accordingly.   Meds ordered this encounter  Medications  . levocetirizine (XYZAL) 5 MG tablet    Sig: Take 1 tablet (5 mg total) by mouth daily as needed for allergies.    Dispense:  90 tablet    Refill:  3    Physical examination: Blood pressure 98/64, pulse 74, temperature 97.9 F (36.6 C), temperature source Oral, resp. rate 18, height 5' 7.6" (1.717 m), weight  164 lb (74.4 kg), SpO2 97 %.  General: Alert, interactive, in no acute distress. HEENT: TMs pearly gray, turbinates mildly edematous without discharge, post-pharynx unremarkable. Neck: Supple without lymphadenopathy. Lungs: Clear to auscultation without wheezing, rhonchi or rales. CV: Normal S1, S2 without murmurs. Skin: Warm and dry, without lesions or rashes.  The following portions of the patient's history were reviewed and updated as appropriate: allergies, current medications, past family history, past medical history, past social history, past surgical history and problem list.  Current Outpatient Medications  Medication Sig Dispense Refill  . acetaminophen (TYLENOL) 325 MG tablet Take 650 mg by mouth every 6 (six) hours as needed.    Marland Kitchen albuterol (PROVENTIL HFA) 108 (90 Base) MCG/ACT inhaler Inhale 1-2 puffs into the lungs every 4 (four) hours as needed for wheezing or shortness of breath. 54 g 3  . cetirizine (ZYRTEC) 10 MG tablet Take 10 mg by mouth daily.    Lennox Solders (EUCRISA) 2 % OINT Apply 1 application topically 2 (two) times daily as needed. 60 g 0  . EPINEPHrine (AUVI-Q) 0.3 mg/0.3 mL IJ SOAJ injection Inject 0.3 mLs (0.3 mg total) into the muscle as needed for anaphylaxis. 2 Device 1  . ibuprofen (ADVIL,MOTRIN) 200 MG tablet Take 200 mg by mouth every 6 (six) hours as needed.    Marland Kitchen levothyroxine (SYNTHROID) 100 MCG tablet TAKE 1 TABLET DAILY    . NON FORMULARY Allergen immunotherapy    . levocetirizine (XYZAL) 5 MG tablet Take 1 tablet (5 mg total) by mouth daily as needed for allergies. 90 tablet 3  . montelukast (SINGULAIR) 10 MG tablet Take 10 mg by mouth daily.  No current facility-administered medications for this visit.    Allergies  Allergen Reactions  . Morphine Itching    I appreciate the opportunity to take part in Julia Flynn's care. Please do not hesitate to contact me with questions.  Sincerely,   R. Edgar Frisk, MD

## 2019-10-02 NOTE — Assessment & Plan Note (Addendum)
Currently well controlled.  Montelukast discontinued due to perceived side effects.  Continue albuterol HFA, 1 to 2 inhalations every 4-6 hours as needed.  Subjective and objective measures of pulmonary function will be followed and the treatment plan will be adjusted accordingly.

## 2019-10-02 NOTE — Patient Instructions (Addendum)
Allergic rhinitis  Continue appropriate allergen avoidance measures and immunotherapy injections per protocol.  Nasal saline spray (i.e., Simply Saline) or nasal saline lavage (i.e., NeilMed) is recommended as needed.  For thick post nasal drainage, add guaifenesin 1200 mg (Mucinex Maximum Strength)  twice daily as needed with adequate hydration as discussed.  Mild intermittent asthma Currently well controlled.  Montelukast discontinued due to perceived side effects.  Continue albuterol HFA, 1 to 2 inhalations every 4-6 hours as needed.  Subjective and objective measures of pulmonary function will be followed and the treatment plan will be adjusted accordingly.   Return in about 1 year (around 10/01/2020), or if symptoms worsen or fail to improve.

## 2019-10-02 NOTE — Assessment & Plan Note (Signed)
   Continue appropriate allergen avoidance measures and immunotherapy injections per protocol.  Nasal saline spray (i.e., Simply Saline) or nasal saline lavage (i.e., NeilMed) is recommended as needed.  For thick post nasal drainage, add guaifenesin 1200 mg (Mucinex Maximum Strength)  twice daily as needed with adequate hydration as discussed.

## 2019-10-07 ENCOUNTER — Ambulatory Visit (INDEPENDENT_AMBULATORY_CARE_PROVIDER_SITE_OTHER): Payer: BC Managed Care – PPO

## 2019-10-07 DIAGNOSIS — J309 Allergic rhinitis, unspecified: Secondary | ICD-10-CM

## 2019-10-14 ENCOUNTER — Ambulatory Visit (INDEPENDENT_AMBULATORY_CARE_PROVIDER_SITE_OTHER): Payer: BC Managed Care – PPO

## 2019-10-14 DIAGNOSIS — J309 Allergic rhinitis, unspecified: Secondary | ICD-10-CM | POA: Diagnosis not present

## 2019-10-21 ENCOUNTER — Ambulatory Visit (INDEPENDENT_AMBULATORY_CARE_PROVIDER_SITE_OTHER): Payer: BC Managed Care – PPO

## 2019-10-21 DIAGNOSIS — J309 Allergic rhinitis, unspecified: Secondary | ICD-10-CM | POA: Diagnosis not present

## 2019-10-25 ENCOUNTER — Ambulatory Visit (INDEPENDENT_AMBULATORY_CARE_PROVIDER_SITE_OTHER): Payer: BC Managed Care – PPO

## 2019-10-25 DIAGNOSIS — J309 Allergic rhinitis, unspecified: Secondary | ICD-10-CM

## 2019-11-04 ENCOUNTER — Ambulatory Visit (INDEPENDENT_AMBULATORY_CARE_PROVIDER_SITE_OTHER): Payer: BC Managed Care – PPO

## 2019-11-04 DIAGNOSIS — J309 Allergic rhinitis, unspecified: Secondary | ICD-10-CM

## 2019-11-11 ENCOUNTER — Ambulatory Visit (INDEPENDENT_AMBULATORY_CARE_PROVIDER_SITE_OTHER): Payer: BC Managed Care – PPO

## 2019-11-11 DIAGNOSIS — J309 Allergic rhinitis, unspecified: Secondary | ICD-10-CM

## 2019-11-18 ENCOUNTER — Ambulatory Visit (INDEPENDENT_AMBULATORY_CARE_PROVIDER_SITE_OTHER): Payer: BC Managed Care – PPO | Admitting: *Deleted

## 2019-11-18 DIAGNOSIS — J309 Allergic rhinitis, unspecified: Secondary | ICD-10-CM | POA: Diagnosis not present

## 2019-11-25 ENCOUNTER — Ambulatory Visit (INDEPENDENT_AMBULATORY_CARE_PROVIDER_SITE_OTHER): Payer: BC Managed Care – PPO

## 2019-11-25 DIAGNOSIS — J309 Allergic rhinitis, unspecified: Secondary | ICD-10-CM

## 2019-12-02 ENCOUNTER — Ambulatory Visit (INDEPENDENT_AMBULATORY_CARE_PROVIDER_SITE_OTHER): Payer: BC Managed Care – PPO

## 2019-12-02 DIAGNOSIS — J309 Allergic rhinitis, unspecified: Secondary | ICD-10-CM

## 2019-12-09 ENCOUNTER — Ambulatory Visit (INDEPENDENT_AMBULATORY_CARE_PROVIDER_SITE_OTHER): Payer: BC Managed Care – PPO

## 2019-12-09 DIAGNOSIS — J309 Allergic rhinitis, unspecified: Secondary | ICD-10-CM

## 2019-12-16 ENCOUNTER — Ambulatory Visit (INDEPENDENT_AMBULATORY_CARE_PROVIDER_SITE_OTHER): Payer: BC Managed Care – PPO | Admitting: *Deleted

## 2019-12-16 DIAGNOSIS — J309 Allergic rhinitis, unspecified: Secondary | ICD-10-CM | POA: Diagnosis not present

## 2019-12-23 ENCOUNTER — Ambulatory Visit (INDEPENDENT_AMBULATORY_CARE_PROVIDER_SITE_OTHER): Payer: BC Managed Care – PPO

## 2019-12-23 DIAGNOSIS — J309 Allergic rhinitis, unspecified: Secondary | ICD-10-CM

## 2019-12-30 ENCOUNTER — Ambulatory Visit (INDEPENDENT_AMBULATORY_CARE_PROVIDER_SITE_OTHER): Payer: BC Managed Care – PPO

## 2019-12-30 DIAGNOSIS — J309 Allergic rhinitis, unspecified: Secondary | ICD-10-CM | POA: Diagnosis not present

## 2020-01-06 ENCOUNTER — Ambulatory Visit (INDEPENDENT_AMBULATORY_CARE_PROVIDER_SITE_OTHER): Payer: BC Managed Care – PPO

## 2020-01-06 DIAGNOSIS — J309 Allergic rhinitis, unspecified: Secondary | ICD-10-CM

## 2020-01-13 ENCOUNTER — Ambulatory Visit (INDEPENDENT_AMBULATORY_CARE_PROVIDER_SITE_OTHER): Payer: BC Managed Care – PPO | Admitting: *Deleted

## 2020-01-13 DIAGNOSIS — J309 Allergic rhinitis, unspecified: Secondary | ICD-10-CM | POA: Diagnosis not present

## 2020-01-13 NOTE — Progress Notes (Signed)
Vials exp 01-12-21 

## 2020-01-14 DIAGNOSIS — J301 Allergic rhinitis due to pollen: Secondary | ICD-10-CM | POA: Diagnosis not present

## 2020-01-27 ENCOUNTER — Ambulatory Visit (INDEPENDENT_AMBULATORY_CARE_PROVIDER_SITE_OTHER): Payer: BC Managed Care – PPO

## 2020-01-27 DIAGNOSIS — J309 Allergic rhinitis, unspecified: Secondary | ICD-10-CM

## 2020-02-11 ENCOUNTER — Ambulatory Visit (INDEPENDENT_AMBULATORY_CARE_PROVIDER_SITE_OTHER): Payer: BC Managed Care – PPO

## 2020-02-11 DIAGNOSIS — J309 Allergic rhinitis, unspecified: Secondary | ICD-10-CM

## 2020-02-17 ENCOUNTER — Ambulatory Visit (INDEPENDENT_AMBULATORY_CARE_PROVIDER_SITE_OTHER): Payer: BC Managed Care – PPO

## 2020-02-17 DIAGNOSIS — J309 Allergic rhinitis, unspecified: Secondary | ICD-10-CM

## 2020-02-24 ENCOUNTER — Ambulatory Visit (INDEPENDENT_AMBULATORY_CARE_PROVIDER_SITE_OTHER): Payer: BC Managed Care – PPO

## 2020-02-24 DIAGNOSIS — J309 Allergic rhinitis, unspecified: Secondary | ICD-10-CM | POA: Diagnosis not present

## 2020-03-02 ENCOUNTER — Ambulatory Visit (INDEPENDENT_AMBULATORY_CARE_PROVIDER_SITE_OTHER): Payer: BC Managed Care – PPO

## 2020-03-02 DIAGNOSIS — J309 Allergic rhinitis, unspecified: Secondary | ICD-10-CM | POA: Diagnosis not present

## 2020-03-09 ENCOUNTER — Ambulatory Visit (INDEPENDENT_AMBULATORY_CARE_PROVIDER_SITE_OTHER): Payer: BC Managed Care – PPO

## 2020-03-09 DIAGNOSIS — J309 Allergic rhinitis, unspecified: Secondary | ICD-10-CM | POA: Diagnosis not present

## 2020-03-20 ENCOUNTER — Ambulatory Visit (INDEPENDENT_AMBULATORY_CARE_PROVIDER_SITE_OTHER): Payer: BC Managed Care – PPO

## 2020-03-20 DIAGNOSIS — J309 Allergic rhinitis, unspecified: Secondary | ICD-10-CM

## 2020-04-06 ENCOUNTER — Ambulatory Visit (INDEPENDENT_AMBULATORY_CARE_PROVIDER_SITE_OTHER): Payer: BC Managed Care – PPO

## 2020-04-06 DIAGNOSIS — J309 Allergic rhinitis, unspecified: Secondary | ICD-10-CM | POA: Diagnosis not present

## 2020-04-20 ENCOUNTER — Ambulatory Visit (INDEPENDENT_AMBULATORY_CARE_PROVIDER_SITE_OTHER): Payer: BC Managed Care – PPO

## 2020-04-20 DIAGNOSIS — J309 Allergic rhinitis, unspecified: Secondary | ICD-10-CM

## 2020-04-28 NOTE — Progress Notes (Signed)
Vials exp 04-28-21 

## 2020-04-29 DIAGNOSIS — J301 Allergic rhinitis due to pollen: Secondary | ICD-10-CM | POA: Diagnosis not present

## 2020-05-04 ENCOUNTER — Ambulatory Visit (INDEPENDENT_AMBULATORY_CARE_PROVIDER_SITE_OTHER): Payer: BC Managed Care – PPO

## 2020-05-04 DIAGNOSIS — J309 Allergic rhinitis, unspecified: Secondary | ICD-10-CM

## 2020-05-20 ENCOUNTER — Ambulatory Visit (INDEPENDENT_AMBULATORY_CARE_PROVIDER_SITE_OTHER): Payer: BC Managed Care – PPO

## 2020-05-20 DIAGNOSIS — J309 Allergic rhinitis, unspecified: Secondary | ICD-10-CM | POA: Diagnosis not present

## 2020-05-29 ENCOUNTER — Other Ambulatory Visit: Payer: Self-pay | Admitting: Allergy

## 2020-06-02 ENCOUNTER — Ambulatory Visit (INDEPENDENT_AMBULATORY_CARE_PROVIDER_SITE_OTHER): Payer: BC Managed Care – PPO

## 2020-06-02 DIAGNOSIS — J309 Allergic rhinitis, unspecified: Secondary | ICD-10-CM

## 2020-06-16 ENCOUNTER — Ambulatory Visit (INDEPENDENT_AMBULATORY_CARE_PROVIDER_SITE_OTHER): Payer: BC Managed Care – PPO

## 2020-06-16 DIAGNOSIS — J309 Allergic rhinitis, unspecified: Secondary | ICD-10-CM

## 2020-06-22 ENCOUNTER — Ambulatory Visit (INDEPENDENT_AMBULATORY_CARE_PROVIDER_SITE_OTHER): Payer: BC Managed Care – PPO

## 2020-06-22 DIAGNOSIS — J309 Allergic rhinitis, unspecified: Secondary | ICD-10-CM

## 2020-06-29 ENCOUNTER — Ambulatory Visit (INDEPENDENT_AMBULATORY_CARE_PROVIDER_SITE_OTHER): Payer: BC Managed Care – PPO

## 2020-06-29 DIAGNOSIS — J309 Allergic rhinitis, unspecified: Secondary | ICD-10-CM | POA: Diagnosis not present

## 2020-07-06 ENCOUNTER — Ambulatory Visit (INDEPENDENT_AMBULATORY_CARE_PROVIDER_SITE_OTHER): Payer: BC Managed Care – PPO | Admitting: *Deleted

## 2020-07-06 DIAGNOSIS — J309 Allergic rhinitis, unspecified: Secondary | ICD-10-CM | POA: Diagnosis not present

## 2020-07-09 DIAGNOSIS — E041 Nontoxic single thyroid nodule: Secondary | ICD-10-CM | POA: Diagnosis not present

## 2020-07-09 DIAGNOSIS — E063 Autoimmune thyroiditis: Secondary | ICD-10-CM | POA: Diagnosis not present

## 2020-07-09 DIAGNOSIS — E038 Other specified hypothyroidism: Secondary | ICD-10-CM | POA: Diagnosis not present

## 2020-07-13 ENCOUNTER — Ambulatory Visit (INDEPENDENT_AMBULATORY_CARE_PROVIDER_SITE_OTHER): Payer: BC Managed Care – PPO

## 2020-07-13 DIAGNOSIS — J309 Allergic rhinitis, unspecified: Secondary | ICD-10-CM | POA: Diagnosis not present

## 2020-07-16 DIAGNOSIS — E038 Other specified hypothyroidism: Secondary | ICD-10-CM | POA: Diagnosis not present

## 2020-07-21 DIAGNOSIS — E039 Hypothyroidism, unspecified: Secondary | ICD-10-CM | POA: Diagnosis not present

## 2020-07-21 DIAGNOSIS — E041 Nontoxic single thyroid nodule: Secondary | ICD-10-CM | POA: Diagnosis not present

## 2020-07-27 ENCOUNTER — Ambulatory Visit (INDEPENDENT_AMBULATORY_CARE_PROVIDER_SITE_OTHER): Payer: BC Managed Care – PPO

## 2020-07-27 DIAGNOSIS — J309 Allergic rhinitis, unspecified: Secondary | ICD-10-CM | POA: Diagnosis not present

## 2020-08-11 ENCOUNTER — Ambulatory Visit (INDEPENDENT_AMBULATORY_CARE_PROVIDER_SITE_OTHER): Payer: BC Managed Care – PPO

## 2020-08-11 DIAGNOSIS — J309 Allergic rhinitis, unspecified: Secondary | ICD-10-CM | POA: Diagnosis not present

## 2020-08-24 ENCOUNTER — Ambulatory Visit (INDEPENDENT_AMBULATORY_CARE_PROVIDER_SITE_OTHER): Payer: BC Managed Care – PPO

## 2020-08-24 DIAGNOSIS — J309 Allergic rhinitis, unspecified: Secondary | ICD-10-CM | POA: Diagnosis not present

## 2020-08-24 NOTE — Progress Notes (Signed)
VIALS EXP 08-25-21

## 2020-08-27 DIAGNOSIS — J301 Allergic rhinitis due to pollen: Secondary | ICD-10-CM | POA: Diagnosis not present

## 2020-09-08 ENCOUNTER — Ambulatory Visit (INDEPENDENT_AMBULATORY_CARE_PROVIDER_SITE_OTHER): Payer: BC Managed Care – PPO

## 2020-09-08 DIAGNOSIS — J309 Allergic rhinitis, unspecified: Secondary | ICD-10-CM | POA: Diagnosis not present

## 2020-09-21 ENCOUNTER — Ambulatory Visit (INDEPENDENT_AMBULATORY_CARE_PROVIDER_SITE_OTHER): Payer: BC Managed Care – PPO

## 2020-09-21 DIAGNOSIS — J309 Allergic rhinitis, unspecified: Secondary | ICD-10-CM

## 2020-09-30 ENCOUNTER — Ambulatory Visit: Payer: BC Managed Care – PPO | Admitting: Allergy and Immunology

## 2020-10-05 ENCOUNTER — Ambulatory Visit: Payer: BC Managed Care – PPO | Admitting: Allergy

## 2020-10-06 ENCOUNTER — Ambulatory Visit (INDEPENDENT_AMBULATORY_CARE_PROVIDER_SITE_OTHER): Payer: BC Managed Care – PPO

## 2020-10-06 DIAGNOSIS — J309 Allergic rhinitis, unspecified: Secondary | ICD-10-CM

## 2020-10-26 ENCOUNTER — Ambulatory Visit (INDEPENDENT_AMBULATORY_CARE_PROVIDER_SITE_OTHER): Payer: BC Managed Care – PPO

## 2020-10-26 DIAGNOSIS — J309 Allergic rhinitis, unspecified: Secondary | ICD-10-CM | POA: Diagnosis not present

## 2020-10-28 ENCOUNTER — Encounter: Payer: Self-pay | Admitting: Allergy

## 2020-10-28 ENCOUNTER — Other Ambulatory Visit: Payer: Self-pay

## 2020-10-28 ENCOUNTER — Ambulatory Visit (INDEPENDENT_AMBULATORY_CARE_PROVIDER_SITE_OTHER): Payer: BC Managed Care – PPO | Admitting: Allergy

## 2020-10-28 VITALS — BP 92/60 | HR 66 | Temp 98.7°F | Resp 16 | Ht 67.25 in | Wt 169.0 lb

## 2020-10-28 DIAGNOSIS — J4520 Mild intermittent asthma, uncomplicated: Secondary | ICD-10-CM | POA: Diagnosis not present

## 2020-10-28 DIAGNOSIS — J301 Allergic rhinitis due to pollen: Secondary | ICD-10-CM | POA: Diagnosis not present

## 2020-10-28 NOTE — Patient Instructions (Addendum)
Allergic rhinitis  Continue appropriate allergen avoidance measures  Continue immunotherapy injections per protocol.  Will provide both injections in Left arm for at least the next 2 months to allow rest of the right arm.  Continue Cetirizine daily use.  On your injection days recommend take additional dose  If you continue to have large local reactions from injection then will start epiwash to help reduce this if needed  Nasal saline spray (i.e., Simply Saline) or nasal saline lavage (i.e., NeilMed) is recommended as needed.  Mild intermittent asthma Currently well controlled.  Montelukast discontinued due to side effects.  Continue albuterol HFA, 1 to 2 inhalations every 4-6 hours as needed.  Asthma control goals:   Full participation in all desired activities (may need albuterol before activity)  Albuterol use two time or less a week on average (not counting use with activity)  Cough interfering with sleep two time or less a month  Oral steroids no more than once a year  No hospitalizations    Return in about 4-6 months or if symptoms worsen or fail to improve.

## 2020-10-28 NOTE — Progress Notes (Signed)
Follow-up Note  RE: Julia Flynn MRN: [id number] DOB: 05-28-1971 Date of Office Visit: 10/28/2020   History of present illness: Julia Flynn is a 50 y.o. female presenting today for follow-up of allergic rhinitis and asthma. She was last seen in the office on 10/02/19 by Dr. Nunzio Cobbs.   She states she has been doing well since her last visit without any major health changes, surgeries or hospitalizations. 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  She notes now that for the past year and a half she has been noting some right arm pain in the area where she does receive her allergen immunotherapy.  She does not have any issues with her left arm.  She states the arm pain lingers until her next injection is due.  It does not seem to improve at any point during the times between her injections.  She states initially her injections were being administered in the back fatty area of her arms but she was having large local reactions in that area thus the injections were given more anteriorly to the side of her arm.  She wonders if she may have a component of myofascial syndrome leading to pain symptoms with the injections.   She states she does still need her allergy medications as she takes cetirizine on a daily basis.  She states she can definitely tell if she misses a dose as she does have increasing symptoms like sneezing.  She does feel that her eczema has slightly improved since she has been on immunotherapy.  She has been at the red vial in some capacity at least for the past year however has been on immunotherapy since April 2020.  Review of systems: Review of Systems  Constitutional: Negative.   HENT: Negative.   Eyes: Negative.   Respiratory: Negative.   Cardiovascular: Negative.   Gastrointestinal: Negative.   Musculoskeletal:       See HPI  Skin: Negative.   Neurological:  Negative.     All other systems negative unless noted above in HPI  Past medical/social/surgical/family history have been reviewed and are unchanged unless specifically indicated below.  No changes  Medication List: Current Outpatient Medications  Medication Sig Dispense Refill  . acetaminophen (TYLENOL) 325 MG tablet Take 650 mg by mouth every 6 (six) hours as needed.    Marland Kitchen albuterol (PROVENTIL HFA) 108 (90 Base) MCG/ACT inhaler Inhale 1-2 puffs into the lungs every 4 (four) hours as needed for wheezing or shortness of breath. 54 g 3  . cetirizine (ZYRTEC) 10 MG tablet Take 10 mg by mouth daily.    Lennox Solders (EUCRISA) 2 % OINT Apply to affected area twice a day as needed 60 g 5  . EPINEPHrine (AUVI-Q) 0.3 mg/0.3 mL IJ SOAJ injection Inject 0.3 mLs (0.3 mg total) into the muscle as needed for anaphylaxis. 2 Device 1  . ibuprofen (ADVIL,MOTRIN) 200 MG tablet Take 200 mg by mouth every 6 (six) hours as needed.    Marland Kitchen levothyroxine (SYNTHROID) 100 MCG tablet TAKE 1 TABLET DAILY    . NON FORMULARY Allergen immunotherapy     No current facility-administered medications for this visit.     Known medication allergies: Allergies  Allergen Reactions  . Morphine Itching  . Latex Dermatitis and Itching     Physical examination: Blood pressure 92/60, pulse 66, temperature 98.7 F (37.1 C), temperature source  Temporal, resp. rate 16, height 5' 7.25" (1.708 m), weight 169 lb (76.7 kg), SpO2 99 %.  General: Alert, interactive, in no acute distress. HEENT: PERRLA, TMs pearly gray, turbinates non-edematous without discharge, post-pharynx non erythematous. Neck: Supple without lymphadenopathy. Lungs: Clear to auscultation without wheezing, rhonchi or rales. {no increased work of breathing. CV: Normal S1, S2 without murmurs. Abdomen: Nondistended, nontender. Skin: Warm and dry, without lesions or rashes. Extremities:  No clubbing, cyanosis or edema. Neuro:   Grossly  intact.  Diagnositics/Labs: None today  Assessment and plan:   Allergic rhinitis  Continue appropriate allergen avoidance measures  Continue immunotherapy injections per protocol.  Will provide both injections in Left arm for at least the next 2 months to allow rest of the right arm.  Continue Cetirizine daily use.  On your injection days recommend take additional dose  If you continue to have large local reactions from injection then will start epiwash to help reduce this if needed  Nasal saline spray (i.e., Simply Saline) or nasal saline lavage (i.e., NeilMed) is recommended as needed.  Mild intermittent asthma Currently well controlled.  Montelukast discontinued due to side effects.  Continue albuterol HFA, 1 to 2 inhalations every 4-6 hours as needed.  Asthma control goals:   Full participation in all desired activities (may need albuterol before activity)  Albuterol use two time or less a week on average (not counting use with activity)  Cough interfering with sleep two time or less a month  Oral steroids no more than once a year  No hospitalizations    Return in about 4-6 months or if symptoms worsen or fail to improve.  I appreciate the opportunity to take part in Lyne's care. Please do not hesitate to contact me with questions.  Sincerely,   Margo Aye, MD Allergy/Immunology [facility]

## 2020-11-04 ENCOUNTER — Ambulatory Visit: Payer: BC Managed Care – PPO | Admitting: Allergy

## 2020-11-09 ENCOUNTER — Ambulatory Visit (INDEPENDENT_AMBULATORY_CARE_PROVIDER_SITE_OTHER): Payer: BC Managed Care – PPO

## 2020-11-09 DIAGNOSIS — J309 Allergic rhinitis, unspecified: Secondary | ICD-10-CM | POA: Diagnosis not present

## 2020-11-16 ENCOUNTER — Ambulatory Visit (INDEPENDENT_AMBULATORY_CARE_PROVIDER_SITE_OTHER): Payer: BC Managed Care – PPO

## 2020-11-16 DIAGNOSIS — J309 Allergic rhinitis, unspecified: Secondary | ICD-10-CM

## 2020-11-23 ENCOUNTER — Ambulatory Visit (INDEPENDENT_AMBULATORY_CARE_PROVIDER_SITE_OTHER): Payer: BC Managed Care – PPO

## 2020-11-23 DIAGNOSIS — J309 Allergic rhinitis, unspecified: Secondary | ICD-10-CM | POA: Diagnosis not present

## 2020-12-01 ENCOUNTER — Ambulatory Visit (INDEPENDENT_AMBULATORY_CARE_PROVIDER_SITE_OTHER): Payer: BC Managed Care – PPO

## 2020-12-01 DIAGNOSIS — J309 Allergic rhinitis, unspecified: Secondary | ICD-10-CM

## 2020-12-07 ENCOUNTER — Ambulatory Visit (INDEPENDENT_AMBULATORY_CARE_PROVIDER_SITE_OTHER): Payer: BC Managed Care – PPO

## 2020-12-07 DIAGNOSIS — J309 Allergic rhinitis, unspecified: Secondary | ICD-10-CM

## 2020-12-11 ENCOUNTER — Other Ambulatory Visit: Payer: Self-pay

## 2020-12-11 ENCOUNTER — Ambulatory Visit (INDEPENDENT_AMBULATORY_CARE_PROVIDER_SITE_OTHER): Payer: BC Managed Care – PPO

## 2020-12-11 ENCOUNTER — Ambulatory Visit (INDEPENDENT_AMBULATORY_CARE_PROVIDER_SITE_OTHER): Payer: BC Managed Care – PPO | Admitting: Physician Assistant

## 2020-12-11 ENCOUNTER — Telehealth: Payer: Self-pay | Admitting: Neurology

## 2020-12-11 ENCOUNTER — Encounter: Payer: Self-pay | Admitting: Physician Assistant

## 2020-12-11 VITALS — BP 95/54 | HR 60 | Ht 67.25 in | Wt 168.0 lb

## 2020-12-11 DIAGNOSIS — Z0000 Encounter for general adult medical examination without abnormal findings: Secondary | ICD-10-CM | POA: Diagnosis not present

## 2020-12-11 DIAGNOSIS — E034 Atrophy of thyroid (acquired): Secondary | ICD-10-CM

## 2020-12-11 DIAGNOSIS — M79604 Pain in right leg: Secondary | ICD-10-CM

## 2020-12-11 DIAGNOSIS — E063 Autoimmune thyroiditis: Secondary | ICD-10-CM | POA: Diagnosis not present

## 2020-12-11 DIAGNOSIS — Z1231 Encounter for screening mammogram for malignant neoplasm of breast: Secondary | ICD-10-CM

## 2020-12-11 DIAGNOSIS — E038 Other specified hypothyroidism: Secondary | ICD-10-CM

## 2020-12-11 DIAGNOSIS — Z131 Encounter for screening for diabetes mellitus: Secondary | ICD-10-CM

## 2020-12-11 DIAGNOSIS — Z13220 Encounter for screening for lipoid disorders: Secondary | ICD-10-CM | POA: Diagnosis not present

## 2020-12-11 DIAGNOSIS — Z1211 Encounter for screening for malignant neoplasm of colon: Secondary | ICD-10-CM

## 2020-12-11 DIAGNOSIS — M545 Low back pain, unspecified: Secondary | ICD-10-CM

## 2020-12-11 LAB — CBC WITH DIFFERENTIAL/PLATELET
Basophils Absolute: 30 cells/uL (ref 0–200)
MPV: 11.4 fL (ref 7.5–12.5)
Platelets: 156 10*3/uL (ref 140–400)
RDW: 13.6 % (ref 11.0–15.0)

## 2020-12-11 NOTE — Telephone Encounter (Signed)
Cologuard order faxed to [phone] with confirmation received. They will contact the patient directly.   

## 2020-12-11 NOTE — Progress Notes (Signed)
Subjective:     Julia Flynn is a 50 y.o. female and is here for a comprehensive physical exam. The patient reports problems - continues to have intermittent low back pain that radiates down right leg into right 3rd, 4th, 5th metatarsals. no known injury. .  Continues to have period. Using condoms for pregnancy prevention.   Social History   Socioeconomic History  . Marital status: Married    Spouse name: Not on file  . Number of children: Not on file  . Years of education: Not on file  . Highest education level: Not on file  Occupational History  . Not on file  Tobacco Use  . Smoking status: Never Smoker  . Smokeless tobacco: Never Used  Vaping Use  . Vaping Use: Never used  Substance and Sexual Activity  . Alcohol use: Never    Alcohol/week: 0.0 standard drinks  . Drug use: Never  . Sexual activity: Yes  Other Topics Concern  . Not on file  Social History Narrative  . Not on file   Social Determinants of Health   Financial Resource Strain: Not on file  Food Insecurity: Not on file  Transportation Needs: Not on file  Physical Activity: Not on file  Stress: Not on file  Social Connections: Not on file  Intimate Partner Violence: Not on file   Health Maintenance  Topic Date Due  . Hepatitis C Screening  Never done  . COLONOSCOPY (Pts 45-27yrs Insurance coverage will need to be confirmed)  Never done  . MAMMOGRAM  05/12/2020  . PAP SMEAR-Modifier  12/11/2020 (Originally 01/11/2016)  . INFLUENZA VACCINE  04/12/2021  . TETANUS/TDAP  01/30/2023  . HIV Screening  Completed  . HPV VACCINES  Aged Out    The following portions of the patient's history were reviewed and updated as appropriate: allergies, current medications, past family history, past medical history, past social history, past surgical history and problem list.  Review of Systems A comprehensive review of systems was negative.   Objective:    BP (!) 95/54   Pulse 60   Ht 5' 7.25" (1.708 m)   Wt 168  lb (76.2 kg)   LMP 12/11/2020   SpO2 99%   BMI 26.12 kg/m  General appearance: alert, cooperative and appears stated age Head: Normocephalic, without obvious abnormality, atraumatic Eyes: conjunctivae/corneas clear. PERRL, EOM's intact. Fundi benign. Ears: normal TM's and external ear canals both ears Nose: Nares normal. Septum midline. Mucosa normal. No drainage or sinus tenderness. Throat: lips, mucosa, and tongue normal; teeth and gums normal Neck: no adenopathy, no carotid bruit, no JVD, supple, symmetrical, trachea midline and thyroid not enlarged, symmetric, no tenderness/mass/nodules Back: symmetric, no curvature. ROM normal. No CVA tenderness. Lungs: clear to auscultation bilaterally Heart: regular rate and rhythm, S1, S2 normal, no murmur, click, rub or gallop Abdomen: soft, non-tender; bowel sounds normal; no masses,  no organomegaly Extremities: extremities normal, atraumatic, no cyanosis or edema Pulses: 2+ and symmetric Skin: Skin color, texture, turgor normal. No rashes or lesions Lymph nodes: Cervical, supraclavicular, and axillary nodes normal. Neurologic: Grossly normal   .. Depression screen [facility] 2/9 12/11/2020 05/16/2019  Decreased Interest 0 0  Down, Depressed, Hopeless 0 0  PHQ - 2 Score 0 0  Altered sleeping 2 0  Tired, decreased energy 1 0  Change in appetite 1 0  Feeling bad or failure about yourself  0 0  Trouble concentrating 0 0  Moving slowly or fidgety/restless 0 0  Suicidal thoughts 0 0  PHQ-9 Score 4 0  Difficult doing work/chores Not difficult at all Not difficult at all   .Marland Kitchen GAD 7 : Generalized Anxiety Score 12/11/2020 05/16/2019  Nervous, Anxious, on Edge 0 0  Control/stop worrying 0 0  Worry too much - different things 0 0  Trouble relaxing 0 0  Restless 0 0  Easily annoyed or irritable 1 1  Afraid - awful might happen 0 0  Total GAD 7 Score 1 1  Anxiety Difficulty Not difficult at all Not difficult at all     Assessment:    Healthy female  exam.      Plan:      Marland KitchenMarland KitchenLaural was seen today for annual exam.  Diagnoses and all orders for this visit:  Routine physical examination -     CBC with Differential/Platelet -     COMPLETE METABOLIC PANEL WITH GFR -     Lipid Panel w/reflex Direct LDL -     Cancel: TSH  Screening for diabetes mellitus -     COMPLETE METABOLIC PANEL WITH GFR  Screening for lipid disorders -     Lipid Panel w/reflex Direct LDL  Hypothyroidism due to Hashimoto's thyroiditis -     Cancel: TSH  Encounter for screening mammogram for malignant neoplasm of breast -     MM 3D SCREEN BREAST BILATERAL  Hypothyroidism due to acquired atrophy of thyroid  Low back pain radiating to right leg -     DG Lumbar Spine Complete; Future  Colon cancer screening -     Cologuard   .Marland Kitchen Discussed 150 minutes of exercise a week.  Encouraged vitamin D 1000 units and Calcium 1300mg  or 4 servings of dairy a day.  Fasting labs ordered. TSH followed by endocrinology.  Mammogram ordered.  Needs pap pt declined today. On menstrual cycle. Condoms for pregnancy prevention.  Declines colonoscopy. Agreed to cologuard.  PHQ/GAD stable/WNL. Decline shingles/covid/flu vaccines.   Discussed low back pain. Seems like dengenerative changes in the L5/S1 distribution. Get imaging. Discussed conservative treatment with tens unit, PT, icy hot patches, posture, good lifting techniques, chiropractic care, NSAIds and muscle relaxers. Follow up as needed for referral to sports medicine, formal PT.    See After Visit Summary for Counseling Recommendations

## 2020-12-11 NOTE — Patient Instructions (Signed)
Low Back Sprain or Strain Rehab Ask your health care provider which exercises are safe for you. Do exercises exactly as told by your health care provider and adjust them as directed. It is normal to feel mild stretching, pulling, tightness, or discomfort as you do these exercises. Stop right away if you feel sudden pain or your pain gets worse. Do not begin these exercises until told by your health care provider. Stretching and range-of-motion exercises These exercises warm up your muscles and joints and improve the movement and flexibility of your back. These exercises also help to relieve pain, numbness, and tingling. Lumbar rotation 1. Lie on your back on a firm surface and bend your knees. 2. Straighten your arms out to your sides so each arm forms a 90-degree angle (right angle) with a side of your body. 3. Slowly move (rotate) both of your knees to one side of your body until you feel a stretch in your lower back (lumbar). Try not to let your shoulders lift off the floor. 4. Hold this position for __________ seconds. 5. Tense your abdominal muscles and slowly move your knees back to the starting position. 6. Repeat this exercise on the other side of your body. Repeat __________ times. Complete this exercise __________ times a day.   Single knee to chest 1. Lie on your back on a firm surface with both legs straight. 2. Bend one of your knees. Use your hands to move your knee up toward your chest until you feel a gentle stretch in your lower back and buttock. ? Hold your leg in this position by holding on to the front of your knee. ? Keep your other leg as straight as possible. 3. Hold this position for __________ seconds. 4. Slowly return to the starting position. 5. Repeat with your other leg. Repeat __________ times. Complete this exercise __________ times a day.   Prone extension on elbows 1. Lie on your abdomen on a firm surface (prone position). 2. Prop yourself up on your  elbows. 3. Use your arms to help lift your chest up until you feel a gentle stretch in your abdomen and your lower back. ? This will place some of your body weight on your elbows. If this is uncomfortable, try stacking pillows under your chest. ? Your hips should stay down, against the surface that you are lying on. Keep your hip and back muscles relaxed. 4. Hold this position for __________ seconds. 5. Slowly relax your upper body and return to the starting position. Repeat __________ times. Complete this exercise __________ times a day.   Strengthening exercises These exercises build strength and endurance in your back. Endurance is the ability to use your muscles for a long time, even after they get tired. Pelvic tilt This exercise strengthens the muscles that lie deep in the abdomen. 1. Lie on your back on a firm surface. Bend your knees and keep your feet flat on the floor. 2. Tense your abdominal muscles. Tip your pelvis up toward the ceiling and flatten your lower back into the floor. ? To help with this exercise, you may place a small towel under your lower back and try to push your back into the towel. 3. Hold this position for __________ seconds. 4. Let your muscles relax completely before you repeat this exercise. Repeat __________ times. Complete this exercise __________ times a day. Alternating arm and leg raises 1. Get on your hands and knees on a firm surface. If you are on  a hard floor, you may want to use padding, such as an exercise mat, to cushion your knees. 2. Line up your arms and legs. Your hands should be directly below your shoulders, and your knees should be directly below your hips. 3. Lift your left leg behind you. At the same time, raise your right arm and straighten it in front of you. ? Do not lift your leg higher than your hip. ? Do not lift your arm higher than your shoulder. ? Keep your abdominal and back muscles tight. ? Keep your hips facing the  ground. ? Do not arch your back. ? Keep your balance carefully, and do not hold your breath. 4. Hold this position for __________ seconds. 5. Slowly return to the starting position. 6. Repeat with your right leg and your left arm. Repeat __________ times. Complete this exercise __________ times a day.   Abdominal set with straight leg raise 1. Lie on your back on a firm surface. 2. Bend one of your knees and keep your other leg straight. 3. Tense your abdominal muscles and lift your straight leg up, 4-6 inches (10-15 cm) off the ground. 4. Keep your abdominal muscles tight and hold this position for __________ seconds. ? Do not hold your breath. ? Do not arch your back. Keep it flat against the ground. 5. Keep your abdominal muscles tense as you slowly lower your leg back to the starting position. 6. Repeat with your other leg. Repeat __________ times. Complete this exercise __________ times a day.   Single leg lower with bent knees 1. Lie on your back on a firm surface. 2. Tense your abdominal muscles and lift your feet off the floor, one foot at a time, so your knees and hips are bent in 90-degree angles (right angles). ? Your knees should be over your hips and your lower legs should be parallel to the floor. 3. Keeping your abdominal muscles tense and your knee bent, slowly lower one of your legs so your toe touches the ground. 4. Lift your leg back up to return to the starting position. ? Do not hold your breath. ? Do not let your back arch. Keep your back flat against the ground. 5. Repeat with your other leg. Repeat __________ times. Complete this exercise __________ times a day. Posture and body mechanics Good posture and healthy body mechanics can help to relieve stress in your body's tissues and joints. Body mechanics refers to the movements and positions of your body while you do your daily activities. Posture is part of body mechanics. Good posture means:  Your spine is in  its natural S-curve position (neutral).  Your shoulders are pulled back slightly.  Your head is not tipped forward. Follow these guidelines to improve your posture and body mechanics in your everyday activities. Standing  When standing, keep your spine neutral and your feet about hip width apart. Keep a slight bend in your knees. Your ears, shoulders, and hips should line up.  When you do a task in which you stand in one place for a long time, place one foot up on a stable object that is 2-4 inches (5-10 cm) high, such as a footstool. This helps keep your spine neutral.   Sitting  When sitting, keep your spine neutral and keep your feet flat on the floor. Use a footrest, if necessary, and keep your thighs parallel to the floor. Avoid rounding your shoulders, and avoid tilting your head forward.  When working at a  desk or a computer, keep your desk at a height where your hands are slightly lower than your elbows. Slide your chair under your desk so you are close enough to maintain good posture.  When working at a computer, place your monitor at a height where you are looking straight ahead and you do not have to tilt your head forward or downward to look at the screen.   Resting  When lying down and resting, avoid positions that are most painful for you.  If you have pain with activities such as sitting, bending, stooping, or squatting, lie in a position in which your body does not bend very much. For example, avoid curling up on your side with your arms and knees near your chest (fetal position).  If you have pain with activities such as standing for a long time or reaching with your arms, lie with your spine in a neutral position and bend your knees slightly. Try the following positions: ? Lying on your side with a pillow between your knees. ? Lying on your back with a pillow under your knees. Lifting  When lifting objects, keep your feet at least shoulder width apart and tighten your  abdominal muscles.  Bend your knees and hips and keep your spine neutral. It is important to lift using the strength of your legs, not your back. Do not lock your knees straight out.  Always ask for help to lift heavy or awkward objects.   This information is not intended to replace advice given to you by your health care provider. Make sure you discuss any questions you have with your health care provider. Document Revised: 12/21/2018 Document Reviewed: 09/20/2018 Elsevier Patient Education  2021 [employer] Health Maintenance, Female Adopting a healthy lifestyle and getting preventive care are important in promoting health and wellness. Ask your health care provider about:  The right schedule for you to have regular tests and exams.  Things you can do on your own to prevent diseases and keep yourself healthy. What should I know about diet, weight, and exercise? Eat a healthy diet  Eat a diet that includes plenty of vegetables, fruits, low-fat dairy products, and lean protein.  Do not eat a lot of foods that are high in solid fats, added sugars, or sodium.   Maintain a healthy weight Body mass index (BMI) is used to identify weight problems. It estimates body fat based on height and weight. Your health care provider can help determine your BMI and help you achieve or maintain a healthy weight. Get regular exercise Get regular exercise. This is one of the most important things you can do for your health. Most adults should:  Exercise for at least 150 minutes each week. The exercise should increase your heart rate and make you sweat (moderate-intensity exercise).  Do strengthening exercises at least twice a week. This is in addition to the moderate-intensity exercise.  Spend less time sitting. Even light physical activity can be beneficial. Watch cholesterol and blood lipids Have your blood tested for lipids and cholesterol at 51 years of age, then have this test every 5 years. Have  your cholesterol levels checked more often if:  Your lipid or cholesterol levels are high.  You are older than 51 years of age.  You are at high risk for heart disease. What should I know about cancer screening? Depending on your health history and family history, you may need to have cancer screening at various ages. This may include screening for:  Breast cancer.  Cervical cancer.  Colorectal cancer.  Skin cancer.  Lung cancer. What should I know about heart disease, diabetes, and high blood pressure? Blood pressure and heart disease  High blood pressure causes heart disease and increases the risk of stroke. This is more likely to develop in people who have high blood pressure readings, are of African descent, or are overweight.  Have your blood pressure checked: ? Every 3-5 years if you are 3018-51 years of age. ? Every year if you are 51 years old or older. Diabetes Have regular diabetes screenings. This checks your fasting blood sugar level. Have the screening done:  Once every three years after age 51 if you are at a normal weight and have a low risk for diabetes.  More often and at a younger age if you are overweight or have a high risk for diabetes. What should I know about preventing infection? Hepatitis B If you have a higher risk for hepatitis B, you should be screened for this virus. Talk with your health care provider to find out if you are at risk for hepatitis B infection. Hepatitis C Testing is recommended for:  Everyone born from [birth year] through 1965.  Anyone with known risk factors for hepatitis C. Sexually transmitted infections (STIs)  Get screened for STIs, including gonorrhea and chlamydia, if: ? You are sexually active and are younger than 10624 years of age. ? You are older than 51 years of age and your health care provider tells you that you are at risk for this type of infection. ? Your sexual activity has changed since you were last screened, and you  are at increased risk for chlamydia or gonorrhea. Ask your health care provider if you are at risk.  Ask your health care provider about whether you are at high risk for HIV. Your health care provider may recommend a prescription medicine to help prevent HIV infection. If you choose to take medicine to prevent HIV, you should first get tested for HIV. You should then be tested every 3 months for as long as you are taking the medicine. Pregnancy  If you are about to stop having your period (premenopausal) and you may become pregnant, seek counseling before you get pregnant.  Take 400 to 800 micrograms (mcg) of folic acid every day if you become pregnant.  Ask for birth control (contraception) if you want to prevent pregnancy. Osteoporosis and menopause Osteoporosis is a disease in which the bones lose minerals and strength with aging. This can result in bone fractures. If you are 51 years old or older, or if you are at risk for osteoporosis and fractures, ask your health care provider if you should:  Be screened for bone loss.  Take a calcium or vitamin D supplement to lower your risk of fractures.  Be given hormone replacement therapy (HRT) to treat symptoms of menopause. Follow these instructions at home: Lifestyle  Do not use any products that contain nicotine or tobacco, such as cigarettes, e-cigarettes, and chewing tobacco. If you need help quitting, ask your health care provider.  Do not use street drugs.  Do not share needles.  Ask your health care provider for help if you need support or information about quitting drugs. Alcohol use  Do not drink alcohol if: ? Your health care provider tells you not to drink. ? You are pregnant, may be pregnant, or are planning to become pregnant.  If you drink alcohol: ? Limit how much you use to 0-1  drink a day. ? Limit intake if you are breastfeeding.  Be aware of how much alcohol is in your drink. In the [place] one drink equals one 12  oz bottle of beer (355 mL), one 5 oz glass of wine (148 mL), or one 1 oz glass of hard liquor (44 mL). General instructions  Schedule regular health, dental, and eye exams.  Stay current with your vaccines.  Tell your health care provider if: ? You often feel depressed. ? You have ever been abused or do not feel safe at home. Summary  Adopting a healthy lifestyle and getting preventive care are important in promoting health and wellness.  Follow your health care provider's instructions about healthy diet, exercising, and getting tested or screened for diseases.  Follow your health care provider's instructions on monitoring your cholesterol and blood pressure. This information is not intended to replace advice given to you by your health care provider. Make sure you discuss any questions you have with your health care provider. Document Revised: 08/22/2018 Document Reviewed: 08/22/2018 Elsevier Patient Education  2021 [employer].

## 2020-12-12 LAB — CBC WITH DIFFERENTIAL/PLATELET
Absolute Monocytes: 248 cells/uL (ref 200–950)
Basophils Relative: 0.9 %
Eosinophils Absolute: 139 cells/uL (ref 15–500)
Eosinophils Relative: 4.2 %
HCT: 38.3 % (ref 35.0–45.0)
Hemoglobin: 12.7 g/dL (ref 11.7–15.5)
Lymphs Abs: 960 cells/uL (ref 850–3900)
MCH: 31.3 pg (ref 27.0–33.0)
MCHC: 33.2 g/dL (ref 32.0–36.0)
MCV: 94.3 fL (ref 80.0–100.0)
Monocytes Relative: 7.5 %
Neutro Abs: 1924 cells/uL (ref 1500–7800)
Neutrophils Relative %: 58.3 %
RBC: 4.06 10*6/uL (ref 3.80–5.10)
Total Lymphocyte: 29.1 %
WBC: 3.3 10*3/uL — ABNORMAL LOW (ref 3.8–10.8)

## 2020-12-12 LAB — COMPLETE METABOLIC PANEL WITH GFR
AG Ratio: 1.7 (calc) (ref 1.0–2.5)
ALT: 6 U/L (ref 6–29)
AST: 14 U/L (ref 10–35)
Albumin: 4.5 g/dL (ref 3.6–5.1)
Alkaline phosphatase (APISO): 32 U/L — ABNORMAL LOW (ref 37–153)
BUN: 13 mg/dL (ref 7–25)
CO2: 28 mmol/L (ref 20–32)
Calcium: 9.7 mg/dL (ref 8.6–10.4)
Chloride: 105 mmol/L (ref 98–110)
Creat: 0.69 mg/dL (ref 0.50–1.05)
GFR, Est African American: 118 mL/min/{1.73_m2} (ref >=60)
GFR, Est Non African American: 102 mL/min/{1.73_m2} (ref >=60)
Globulin: 2.7 g/dL (calc) (ref 1.9–3.7)
Glucose, Bld: 80 mg/dL (ref 65–139)
Potassium: 4.3 mmol/L (ref 3.5–5.3)
Sodium: 142 mmol/L (ref 135–146)
Total Bilirubin: 0.7 mg/dL (ref 0.2–1.2)
Total Protein: 7.2 g/dL (ref 6.1–8.1)

## 2020-12-12 LAB — LIPID PANEL W/REFLEX DIRECT LDL
Cholesterol: 209 mg/dL — ABNORMAL HIGH (ref <200)
HDL: 48 mg/dL — ABNORMAL LOW (ref >=50)
LDL Cholesterol (Calc): 141 mg/dL (calc) — ABNORMAL HIGH
Non-HDL Cholesterol (Calc): 161 mg/dL (calc) — ABNORMAL HIGH (ref <130)
Total CHOL/HDL Ratio: 4.4 (calc) (ref <5.0)
Triglycerides: 91 mg/dL (ref <150)

## 2020-12-14 ENCOUNTER — Encounter: Payer: Self-pay | Admitting: Physician Assistant

## 2020-12-14 DIAGNOSIS — M545 Low back pain, unspecified: Secondary | ICD-10-CM | POA: Insufficient documentation

## 2020-12-14 DIAGNOSIS — M51369 Other intervertebral disc degeneration, lumbar region without mention of lumbar back pain or lower extremity pain: Secondary | ICD-10-CM | POA: Insufficient documentation

## 2020-12-14 DIAGNOSIS — M79604 Pain in right leg: Secondary | ICD-10-CM | POA: Insufficient documentation

## 2020-12-14 DIAGNOSIS — M5136 Other intervertebral disc degeneration, lumbar region: Secondary | ICD-10-CM | POA: Insufficient documentation

## 2020-12-14 NOTE — Progress Notes (Signed)
Julia Flynn,   Hemoglobin great.  WBC a little low but no other CBC abnormalities.  Could be evidence of a recent vital infection.  Glucose, kidney, liver look good.  LDL not to goal but overall 10 year risk is 1.4 percent. Continue to work on healthy diet and exercise and will continue to monitor.

## 2020-12-14 NOTE — Progress Notes (Signed)
As expected some degenerative changes at L5/S1. Treatment plan stays the same. If this began to worsen could consider MRI and more interventional planning.

## 2020-12-16 ENCOUNTER — Other Ambulatory Visit: Payer: Self-pay

## 2020-12-16 ENCOUNTER — Ambulatory Visit (INDEPENDENT_AMBULATORY_CARE_PROVIDER_SITE_OTHER): Payer: BC Managed Care – PPO

## 2020-12-16 DIAGNOSIS — Z1231 Encounter for screening mammogram for malignant neoplasm of breast: Secondary | ICD-10-CM | POA: Diagnosis not present

## 2020-12-18 NOTE — Progress Notes (Signed)
Normal mammogram. Follow up in one year.

## 2020-12-20 DIAGNOSIS — Z1211 Encounter for screening for malignant neoplasm of colon: Secondary | ICD-10-CM | POA: Diagnosis not present

## 2020-12-21 ENCOUNTER — Ambulatory Visit (INDEPENDENT_AMBULATORY_CARE_PROVIDER_SITE_OTHER): Payer: BC Managed Care – PPO

## 2020-12-21 DIAGNOSIS — J309 Allergic rhinitis, unspecified: Secondary | ICD-10-CM | POA: Diagnosis not present

## 2020-12-22 LAB — COLOGUARD: Cologuard: NEGATIVE

## 2020-12-28 NOTE — Progress Notes (Signed)
VIALS EXP 12-28-21 

## 2020-12-29 DIAGNOSIS — J301 Allergic rhinitis due to pollen: Secondary | ICD-10-CM | POA: Diagnosis not present

## 2020-12-30 ENCOUNTER — Telehealth: Payer: Self-pay | Admitting: Neurology

## 2020-12-30 NOTE — Telephone Encounter (Signed)
Cologuard negative. Patient made aware. Abstracted.

## 2021-01-11 ENCOUNTER — Ambulatory Visit (INDEPENDENT_AMBULATORY_CARE_PROVIDER_SITE_OTHER): Payer: BC Managed Care – PPO

## 2021-01-11 DIAGNOSIS — J309 Allergic rhinitis, unspecified: Secondary | ICD-10-CM

## 2021-02-01 ENCOUNTER — Ambulatory Visit (INDEPENDENT_AMBULATORY_CARE_PROVIDER_SITE_OTHER): Payer: BC Managed Care – PPO

## 2021-02-01 DIAGNOSIS — J309 Allergic rhinitis, unspecified: Secondary | ICD-10-CM | POA: Diagnosis not present

## 2021-02-26 ENCOUNTER — Ambulatory Visit (INDEPENDENT_AMBULATORY_CARE_PROVIDER_SITE_OTHER): Payer: BC Managed Care – PPO

## 2021-02-26 DIAGNOSIS — J309 Allergic rhinitis, unspecified: Secondary | ICD-10-CM | POA: Diagnosis not present

## 2021-03-11 DIAGNOSIS — M25511 Pain in right shoulder: Secondary | ICD-10-CM | POA: Diagnosis not present

## 2021-03-16 DIAGNOSIS — M25511 Pain in right shoulder: Secondary | ICD-10-CM | POA: Diagnosis not present

## 2021-03-18 DIAGNOSIS — M25511 Pain in right shoulder: Secondary | ICD-10-CM | POA: Diagnosis not present

## 2021-03-19 ENCOUNTER — Ambulatory Visit (INDEPENDENT_AMBULATORY_CARE_PROVIDER_SITE_OTHER): Payer: BC Managed Care – PPO | Admitting: *Deleted

## 2021-03-19 DIAGNOSIS — J309 Allergic rhinitis, unspecified: Secondary | ICD-10-CM | POA: Diagnosis not present

## 2021-03-22 DIAGNOSIS — M25511 Pain in right shoulder: Secondary | ICD-10-CM | POA: Diagnosis not present

## 2021-03-25 DIAGNOSIS — M25511 Pain in right shoulder: Secondary | ICD-10-CM | POA: Diagnosis not present

## 2021-03-31 DIAGNOSIS — M25511 Pain in right shoulder: Secondary | ICD-10-CM | POA: Diagnosis not present

## 2021-04-02 DIAGNOSIS — M25511 Pain in right shoulder: Secondary | ICD-10-CM | POA: Diagnosis not present

## 2021-04-06 DIAGNOSIS — M25511 Pain in right shoulder: Secondary | ICD-10-CM | POA: Diagnosis not present

## 2021-04-08 DIAGNOSIS — M25511 Pain in right shoulder: Secondary | ICD-10-CM | POA: Diagnosis not present

## 2021-04-09 ENCOUNTER — Ambulatory Visit (INDEPENDENT_AMBULATORY_CARE_PROVIDER_SITE_OTHER): Payer: BC Managed Care – PPO

## 2021-04-09 DIAGNOSIS — J309 Allergic rhinitis, unspecified: Secondary | ICD-10-CM | POA: Diagnosis not present

## 2021-04-12 DIAGNOSIS — M25511 Pain in right shoulder: Secondary | ICD-10-CM | POA: Diagnosis not present

## 2021-04-14 DIAGNOSIS — M25511 Pain in right shoulder: Secondary | ICD-10-CM | POA: Diagnosis not present

## 2021-04-16 ENCOUNTER — Ambulatory Visit (INDEPENDENT_AMBULATORY_CARE_PROVIDER_SITE_OTHER): Payer: BC Managed Care – PPO

## 2021-04-16 DIAGNOSIS — J309 Allergic rhinitis, unspecified: Secondary | ICD-10-CM

## 2021-04-26 ENCOUNTER — Ambulatory Visit (INDEPENDENT_AMBULATORY_CARE_PROVIDER_SITE_OTHER): Payer: BC Managed Care – PPO

## 2021-04-26 DIAGNOSIS — J309 Allergic rhinitis, unspecified: Secondary | ICD-10-CM

## 2021-04-26 DIAGNOSIS — M25511 Pain in right shoulder: Secondary | ICD-10-CM | POA: Diagnosis not present

## 2021-04-28 ENCOUNTER — Other Ambulatory Visit: Payer: Self-pay

## 2021-04-28 ENCOUNTER — Ambulatory Visit (INDEPENDENT_AMBULATORY_CARE_PROVIDER_SITE_OTHER): Payer: BC Managed Care – PPO | Admitting: Allergy

## 2021-04-28 ENCOUNTER — Encounter: Payer: Self-pay | Admitting: Allergy

## 2021-04-28 VITALS — BP 80/60 | HR 67 | Temp 98.5°F | Resp 16

## 2021-04-28 DIAGNOSIS — J4520 Mild intermittent asthma, uncomplicated: Secondary | ICD-10-CM

## 2021-04-28 DIAGNOSIS — J301 Allergic rhinitis due to pollen: Secondary | ICD-10-CM

## 2021-04-28 NOTE — Patient Instructions (Addendum)
Allergic rhinitis Continue appropriate allergen avoidance measures Continue immunotherapy injections per protocol.  Will continue to provide both injections in Left arm until she is ready to go back to both arms once her right arm is fully rehabbed Continue Cetirizine daily use.  On your injection days recommend take additional dose If you continue to have large local reactions from injection then will start epiwash to help reduce this if needed Nasal saline spray (i.e., Simply Saline) or nasal saline lavage (i.e., NeilMed) or Xlear nasal spray that you have is recommended as needed.  Mild intermittent asthma Currently well controlled. Montelukast discontinued due to side effects. Continue albuterol HFA, 1 to 2 inhalations every 4-6 hours as needed.  Asthma control goals:  Full participation in all desired activities (may need albuterol before activity) Albuterol use two time or less a week on average (not counting use with activity) Cough interfering with sleep two time or less a month Oral steroids no more than once a year No hospitalizations    Return in about 12 months or if symptoms worsen or fail to improve.

## 2021-04-28 NOTE — Progress Notes (Signed)
Follow-up Note  RE: Julia Flynn MRN: [id number] DOB: 03-Jul-1971 Date of Office Visit: 04/28/2021   History of present illness: Julia Flynn is a 50 y.o. female presenting today for follow-up of allergic rhinitis, asthma.  She was Last seen in the office on 10/28/2020 by myself. She is on allergen immunotherapy at maintenance dosing at every 3-week intervals.  She states she may have about a half dollar size lump on her arm that lasts about 12 hours and resolves.  It can have some itching.  Is not that bothersome to her.  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  She states the shoulder is almost "thought". She continues on cetirizine daily.  She started using the nasal spray Xlear nasal spray after recommendation from a friend.  She states that nasal spray is helpful for her when she uses it hasn't used this spray in about 6 weeks. She does state she is noticing some more drainage and has been having some ear pressure. She has not had any respiratory symptoms and has had no albuterol inhaler use.   Review of systems: Review of Systems  Constitutional: Negative.   HENT:  Positive for congestion.   Eyes: Negative.   Respiratory: Negative.    Cardiovascular: Negative.   Gastrointestinal: Negative.   Musculoskeletal: Negative.   Skin: Negative.   Neurological: Negative.    All other systems negative unless noted above in HPI  Past medical/social/surgical/family history have been reviewed and are unchanged unless specifically indicated below.  No changes  Medication List: Current Outpatient Medications  Medication Sig Dispense Refill   Crisaborole (EUCRISA) 2 % OINT Apply to affected area twice a day as needed 60 g 5   EPINEPHrine (AUVI-Q) 0.3 mg/0.3 mL IJ SOAJ injection Inject 0.3 mLs (0.3 mg total) into the muscle as needed for anaphylaxis. 2 Device 1   ibuprofen  (ADVIL,MOTRIN) 200 MG tablet Take 200 mg by mouth every 6 (six) hours as needed.     levocetirizine (XYZAL) 5 MG tablet Take 5 mg by mouth every evening.     levothyroxine (SYNTHROID) 100 MCG tablet TAKE 1 TABLET DAILY     NON FORMULARY Allergen immunotherapy     acetaminophen (TYLENOL) 325 MG tablet Take 650 mg by mouth every 6 (six) hours as needed. (Patient not taking: Reported on 04/28/2021)     albuterol (PROVENTIL HFA) 108 (90 Base) MCG/ACT inhaler Inhale 1-2 puffs into the lungs every 4 (four) hours as needed for wheezing or shortness of breath. (Patient not taking: Reported on 04/28/2021) 54 g 3   No current facility-administered medications for this visit.     Known medication allergies: Allergies  Allergen Reactions   Morphine Itching   Latex Dermatitis and Itching     Physical examination: Blood pressure (!) 80/60, pulse 67, temperature 98.5 F (36.9 C), temperature source Temporal, resp. rate 16, SpO2 97 %.  General: Alert, interactive, in no acute distress. HEENT: PERRLA, TMs pearly gray, turbinates mildly edematous with clear discharge, post-pharynx non erythematous. Neck: Supple without lymphadenopathy. Lungs: Clear to auscultation without wheezing, rhonchi or rales. {no increased work of breathing. CV: Normal S1, S2 without murmurs. Abdomen: Nondistended, nontender. Skin: Warm and dry, without lesions or rashes. Extremities:  No clubbing, cyanosis or edema. Neuro:   Grossly intact.  Diagnositics/Labs: None today  Assessment and plan:   Allergic rhinitis Continue appropriate allergen avoidance measures  Continue immunotherapy injections per protocol.  Will continue to provide both injections in Left arm until she is ready to go back to both arms once her right arm is fully rehabbed Continue Cetirizine daily use.  On your injection days recommend take additional dose If you continue to have large local reactions from injection then will start epiwash to help reduce  this if needed Nasal saline spray (i.e., Simply Saline) or nasal saline lavage (i.e., NeilMed) or Xlear nasal spray that you have is recommended as needed.  Mild intermittent asthma Currently well controlled. Montelukast discontinued due to side effects. Continue albuterol HFA, 1 to 2 inhalations every 4-6 hours as needed.  Asthma control goals:  Full participation in all desired activities (may need albuterol before activity) Albuterol use two time or less a week on average (not counting use with activity) Cough interfering with sleep two time or less a month Oral steroids no more than once a year No hospitalizations  Return in about 12 months or if symptoms worsen or fail to improve.  I appreciate the opportunity to take part in Julia Flynn's care. Please do not hesitate to contact me with questions.  Sincerely,   Margo Aye, MD Allergy/Immunology [facility]

## 2021-05-03 ENCOUNTER — Ambulatory Visit (INDEPENDENT_AMBULATORY_CARE_PROVIDER_SITE_OTHER): Payer: BC Managed Care – PPO

## 2021-05-03 DIAGNOSIS — J309 Allergic rhinitis, unspecified: Secondary | ICD-10-CM

## 2021-05-05 DIAGNOSIS — M25511 Pain in right shoulder: Secondary | ICD-10-CM | POA: Diagnosis not present

## 2021-05-10 ENCOUNTER — Ambulatory Visit (INDEPENDENT_AMBULATORY_CARE_PROVIDER_SITE_OTHER): Payer: BC Managed Care – PPO

## 2021-05-10 DIAGNOSIS — J309 Allergic rhinitis, unspecified: Secondary | ICD-10-CM | POA: Diagnosis not present

## 2021-05-12 DIAGNOSIS — M25511 Pain in right shoulder: Secondary | ICD-10-CM | POA: Diagnosis not present

## 2021-06-01 ENCOUNTER — Ambulatory Visit (INDEPENDENT_AMBULATORY_CARE_PROVIDER_SITE_OTHER): Payer: BC Managed Care – PPO

## 2021-06-01 DIAGNOSIS — J309 Allergic rhinitis, unspecified: Secondary | ICD-10-CM | POA: Diagnosis not present

## 2021-06-22 ENCOUNTER — Ambulatory Visit (INDEPENDENT_AMBULATORY_CARE_PROVIDER_SITE_OTHER): Payer: BC Managed Care – PPO

## 2021-06-22 DIAGNOSIS — J309 Allergic rhinitis, unspecified: Secondary | ICD-10-CM

## 2021-07-06 DIAGNOSIS — E063 Autoimmune thyroiditis: Secondary | ICD-10-CM | POA: Diagnosis not present

## 2021-07-06 DIAGNOSIS — E038 Other specified hypothyroidism: Secondary | ICD-10-CM | POA: Diagnosis not present

## 2021-07-06 DIAGNOSIS — E041 Nontoxic single thyroid nodule: Secondary | ICD-10-CM | POA: Diagnosis not present

## 2021-07-12 ENCOUNTER — Ambulatory Visit (INDEPENDENT_AMBULATORY_CARE_PROVIDER_SITE_OTHER): Payer: BC Managed Care – PPO

## 2021-07-12 DIAGNOSIS — J309 Allergic rhinitis, unspecified: Secondary | ICD-10-CM

## 2021-07-19 DIAGNOSIS — E041 Nontoxic single thyroid nodule: Secondary | ICD-10-CM | POA: Diagnosis not present

## 2021-07-19 DIAGNOSIS — E039 Hypothyroidism, unspecified: Secondary | ICD-10-CM | POA: Diagnosis not present

## 2021-07-26 DIAGNOSIS — J301 Allergic rhinitis due to pollen: Secondary | ICD-10-CM | POA: Diagnosis not present

## 2021-07-26 NOTE — Progress Notes (Signed)
VIALS MADE. EXP 07-26-22

## 2021-08-02 ENCOUNTER — Ambulatory Visit (INDEPENDENT_AMBULATORY_CARE_PROVIDER_SITE_OTHER): Payer: BC Managed Care – PPO

## 2021-08-02 DIAGNOSIS — J309 Allergic rhinitis, unspecified: Secondary | ICD-10-CM

## 2021-08-23 ENCOUNTER — Ambulatory Visit (INDEPENDENT_AMBULATORY_CARE_PROVIDER_SITE_OTHER): Payer: BC Managed Care – PPO

## 2021-08-23 DIAGNOSIS — J309 Allergic rhinitis, unspecified: Secondary | ICD-10-CM | POA: Diagnosis not present

## 2021-09-13 ENCOUNTER — Ambulatory Visit (INDEPENDENT_AMBULATORY_CARE_PROVIDER_SITE_OTHER): Payer: BC Managed Care – PPO

## 2021-09-13 DIAGNOSIS — J309 Allergic rhinitis, unspecified: Secondary | ICD-10-CM

## 2021-09-20 ENCOUNTER — Ambulatory Visit: Payer: Self-pay

## 2021-09-20 ENCOUNTER — Ambulatory Visit (INDEPENDENT_AMBULATORY_CARE_PROVIDER_SITE_OTHER): Payer: BC Managed Care – PPO

## 2021-09-20 DIAGNOSIS — J309 Allergic rhinitis, unspecified: Secondary | ICD-10-CM

## 2021-09-27 ENCOUNTER — Ambulatory Visit (INDEPENDENT_AMBULATORY_CARE_PROVIDER_SITE_OTHER): Payer: BC Managed Care – PPO | Admitting: *Deleted

## 2021-09-27 DIAGNOSIS — J309 Allergic rhinitis, unspecified: Secondary | ICD-10-CM | POA: Diagnosis not present

## 2021-10-04 ENCOUNTER — Ambulatory Visit (INDEPENDENT_AMBULATORY_CARE_PROVIDER_SITE_OTHER): Payer: BC Managed Care – PPO

## 2021-10-04 DIAGNOSIS — J309 Allergic rhinitis, unspecified: Secondary | ICD-10-CM

## 2021-10-11 ENCOUNTER — Ambulatory Visit (INDEPENDENT_AMBULATORY_CARE_PROVIDER_SITE_OTHER): Payer: BC Managed Care – PPO

## 2021-10-11 DIAGNOSIS — J309 Allergic rhinitis, unspecified: Secondary | ICD-10-CM | POA: Diagnosis not present

## 2021-11-01 ENCOUNTER — Ambulatory Visit (INDEPENDENT_AMBULATORY_CARE_PROVIDER_SITE_OTHER): Payer: BC Managed Care – PPO

## 2021-11-01 DIAGNOSIS — J309 Allergic rhinitis, unspecified: Secondary | ICD-10-CM

## 2021-11-22 DIAGNOSIS — J3081 Allergic rhinitis due to animal (cat) (dog) hair and dander: Secondary | ICD-10-CM | POA: Diagnosis not present

## 2021-11-23 NOTE — Progress Notes (Signed)
VIALS EXPP 11-24-22 ?

## 2021-11-29 ENCOUNTER — Ambulatory Visit (INDEPENDENT_AMBULATORY_CARE_PROVIDER_SITE_OTHER): Payer: BC Managed Care – PPO

## 2021-11-29 DIAGNOSIS — J309 Allergic rhinitis, unspecified: Secondary | ICD-10-CM

## 2021-12-27 ENCOUNTER — Ambulatory Visit (INDEPENDENT_AMBULATORY_CARE_PROVIDER_SITE_OTHER): Payer: BC Managed Care – PPO

## 2021-12-27 DIAGNOSIS — J309 Allergic rhinitis, unspecified: Secondary | ICD-10-CM | POA: Diagnosis not present

## 2022-01-13 DIAGNOSIS — L309 Dermatitis, unspecified: Secondary | ICD-10-CM | POA: Diagnosis not present

## 2022-01-13 DIAGNOSIS — J3089 Other allergic rhinitis: Secondary | ICD-10-CM | POA: Diagnosis not present

## 2022-01-13 DIAGNOSIS — J301 Allergic rhinitis due to pollen: Secondary | ICD-10-CM | POA: Diagnosis not present

## 2022-01-25 ENCOUNTER — Ambulatory Visit (INDEPENDENT_AMBULATORY_CARE_PROVIDER_SITE_OTHER): Payer: BC Managed Care – PPO

## 2022-01-25 DIAGNOSIS — J301 Allergic rhinitis due to pollen: Secondary | ICD-10-CM | POA: Diagnosis not present

## 2022-01-25 DIAGNOSIS — J309 Allergic rhinitis, unspecified: Secondary | ICD-10-CM | POA: Diagnosis not present

## 2022-01-25 DIAGNOSIS — J3089 Other allergic rhinitis: Secondary | ICD-10-CM | POA: Diagnosis not present

## 2022-01-25 DIAGNOSIS — L309 Dermatitis, unspecified: Secondary | ICD-10-CM | POA: Diagnosis not present

## 2022-02-14 ENCOUNTER — Telehealth: Payer: Self-pay

## 2022-02-14 NOTE — Telephone Encounter (Signed)
Patient calling to let us know she has transferred to [facility] in [place] bc she has moved there. She wants to take her vials with her once she receives her last injections with [place] on Monday 02/21/2022. I will fax the paperwork that needs to be signed by our physician and  their physicians that they will take responsibility of taking over the allergy injections. The other paperwork and vials will be taken by the patient that has the instructions of the vials and dosage instructions.

## 2022-02-21 ENCOUNTER — Ambulatory Visit: Payer: BC Managed Care – PPO

## 2022-02-21 ENCOUNTER — Ambulatory Visit (INDEPENDENT_AMBULATORY_CARE_PROVIDER_SITE_OTHER): Payer: BC Managed Care – PPO

## 2022-02-21 DIAGNOSIS — J309 Allergic rhinitis, unspecified: Secondary | ICD-10-CM | POA: Diagnosis not present

## 2022-03-10 NOTE — Progress Notes (Unsigned)
Immunotherapy   Patient Details  Name: Julia Flynn MRN: [id number] Date of Birth: October 06, 1970  03/10/2022  Julia Flynn here to pick up  Red 1:100,000 (Pollen-Pet and DM-C-[id number]-T-RW) Following schedule: C  Frequency:1 time per week, @.50 Q 2 weeks Epi-Pen:Epi-Pen Available  Consent signed and patient instructions given.    Julia Flynn J Julia Flynn 03/10/2022, 1:23 PM

## 2022-03-29 DIAGNOSIS — J3089 Other allergic rhinitis: Secondary | ICD-10-CM | POA: Diagnosis not present

## 2022-03-29 DIAGNOSIS — J301 Allergic rhinitis due to pollen: Secondary | ICD-10-CM | POA: Diagnosis not present

## 2022-04-07 DIAGNOSIS — J301 Allergic rhinitis due to pollen: Secondary | ICD-10-CM | POA: Diagnosis not present

## 2022-04-07 DIAGNOSIS — J3089 Other allergic rhinitis: Secondary | ICD-10-CM | POA: Diagnosis not present

## 2022-04-14 DIAGNOSIS — J3089 Other allergic rhinitis: Secondary | ICD-10-CM | POA: Diagnosis not present

## 2022-04-14 DIAGNOSIS — J301 Allergic rhinitis due to pollen: Secondary | ICD-10-CM | POA: Diagnosis not present

## 2022-04-25 DIAGNOSIS — J301 Allergic rhinitis due to pollen: Secondary | ICD-10-CM | POA: Diagnosis not present

## 2022-04-25 DIAGNOSIS — J3089 Other allergic rhinitis: Secondary | ICD-10-CM | POA: Diagnosis not present

## 2022-05-03 DIAGNOSIS — J3089 Other allergic rhinitis: Secondary | ICD-10-CM | POA: Diagnosis not present

## 2022-05-03 DIAGNOSIS — J301 Allergic rhinitis due to pollen: Secondary | ICD-10-CM | POA: Diagnosis not present

## 2022-05-31 DIAGNOSIS — J3089 Other allergic rhinitis: Secondary | ICD-10-CM | POA: Diagnosis not present

## 2022-05-31 DIAGNOSIS — J301 Allergic rhinitis due to pollen: Secondary | ICD-10-CM | POA: Diagnosis not present

## 2022-06-28 DIAGNOSIS — E063 Autoimmune thyroiditis: Secondary | ICD-10-CM | POA: Diagnosis not present

## 2022-06-28 DIAGNOSIS — J3089 Other allergic rhinitis: Secondary | ICD-10-CM | POA: Diagnosis not present

## 2022-06-28 DIAGNOSIS — E041 Nontoxic single thyroid nodule: Secondary | ICD-10-CM | POA: Diagnosis not present

## 2022-06-28 DIAGNOSIS — E038 Other specified hypothyroidism: Secondary | ICD-10-CM | POA: Diagnosis not present

## 2022-06-28 DIAGNOSIS — J301 Allergic rhinitis due to pollen: Secondary | ICD-10-CM | POA: Diagnosis not present

## 2022-06-29 IMAGING — DX DG LUMBAR SPINE COMPLETE 4+V
5 series · 5 of 5 positions shown · non-contrast
Comparison: None.

CLINICAL DATA: Low back pain

EXAM:
LUMBAR SPINE - COMPLETE 4+ VIEW

[l-spine ap]
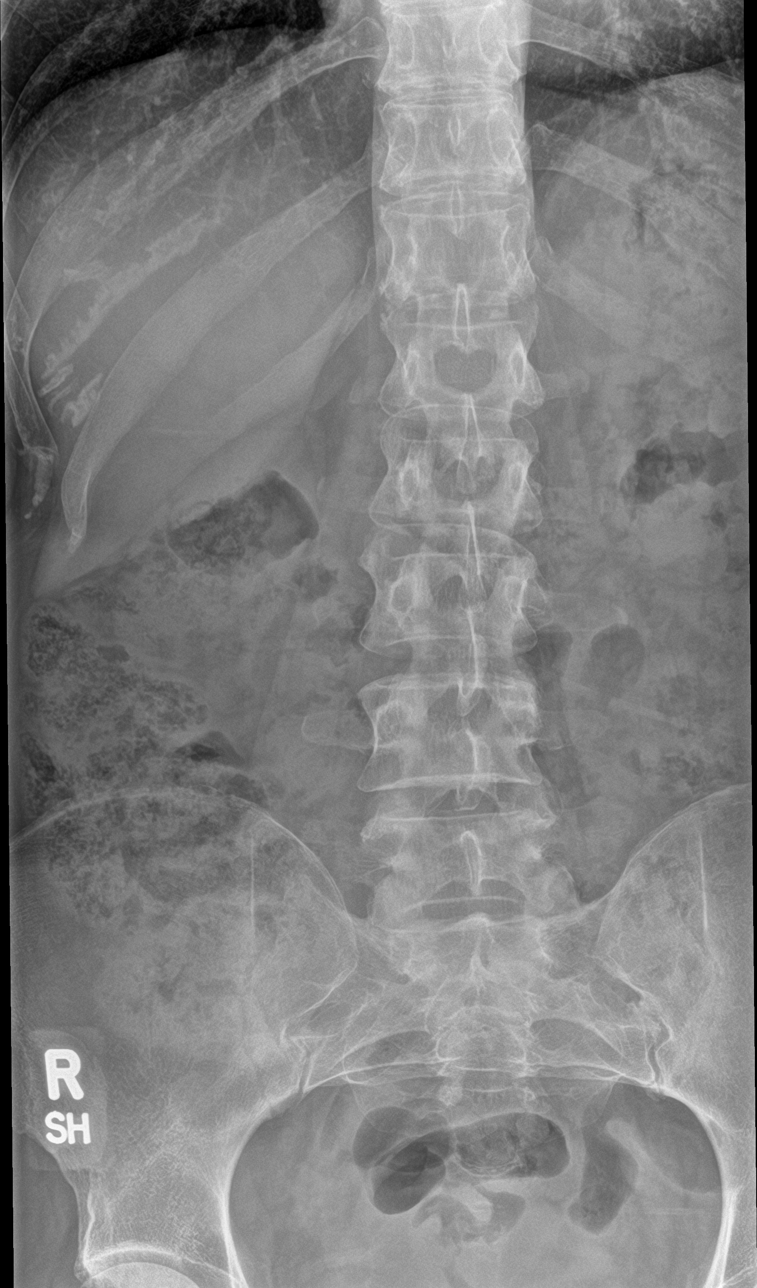

[l-spine obl (1 of 2)]
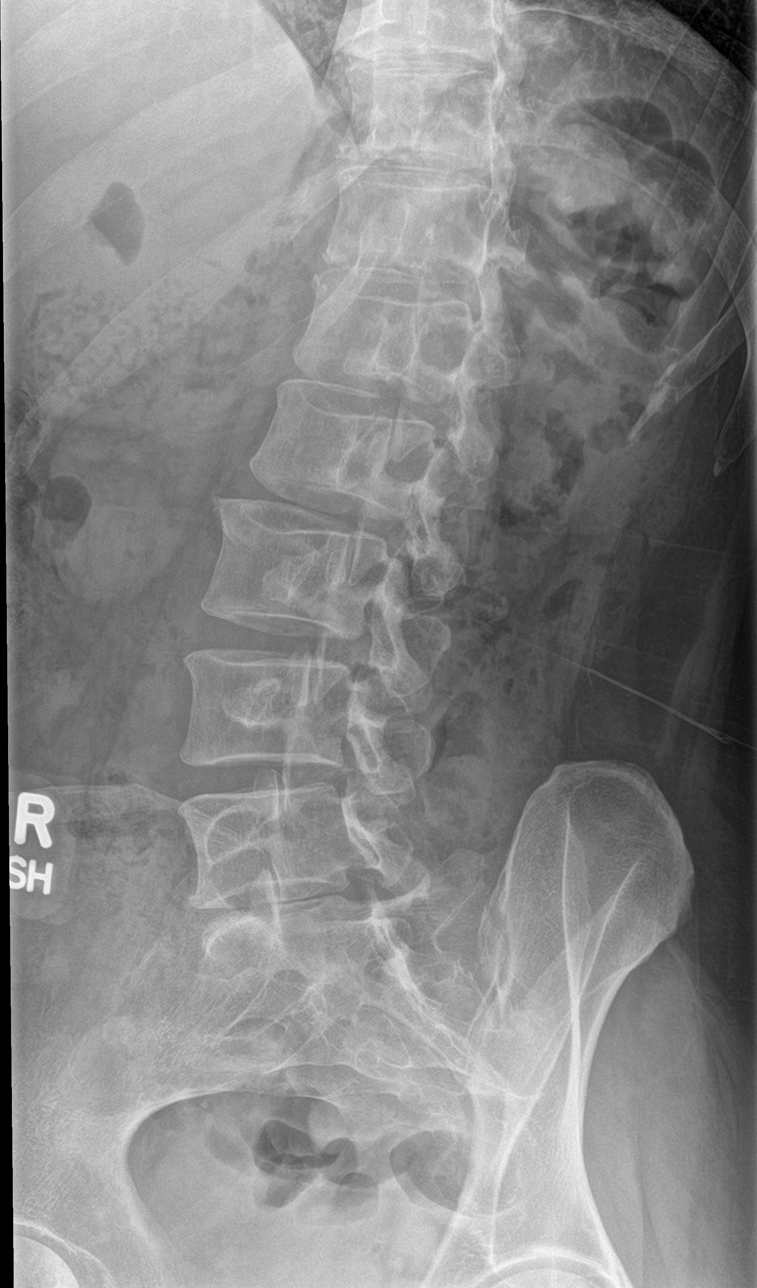

[l-spine obl (2 of 2)]
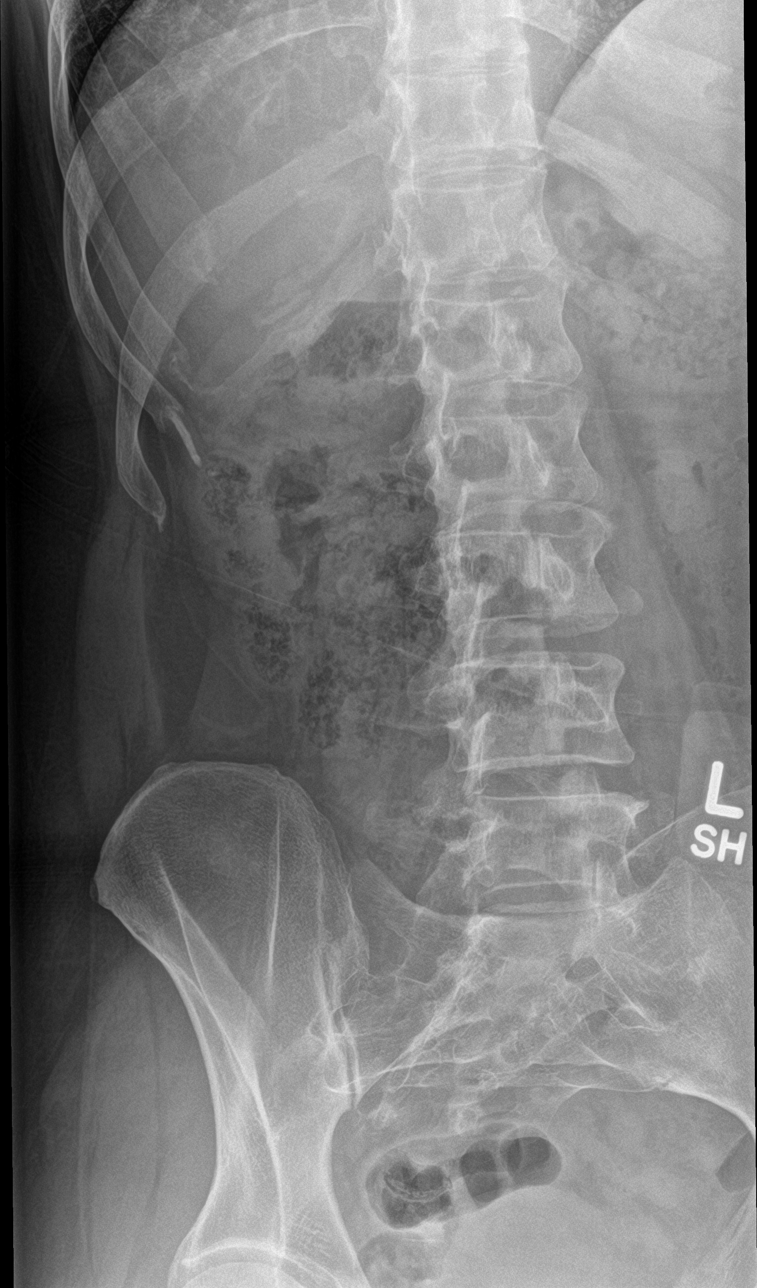

[l-spine lat]
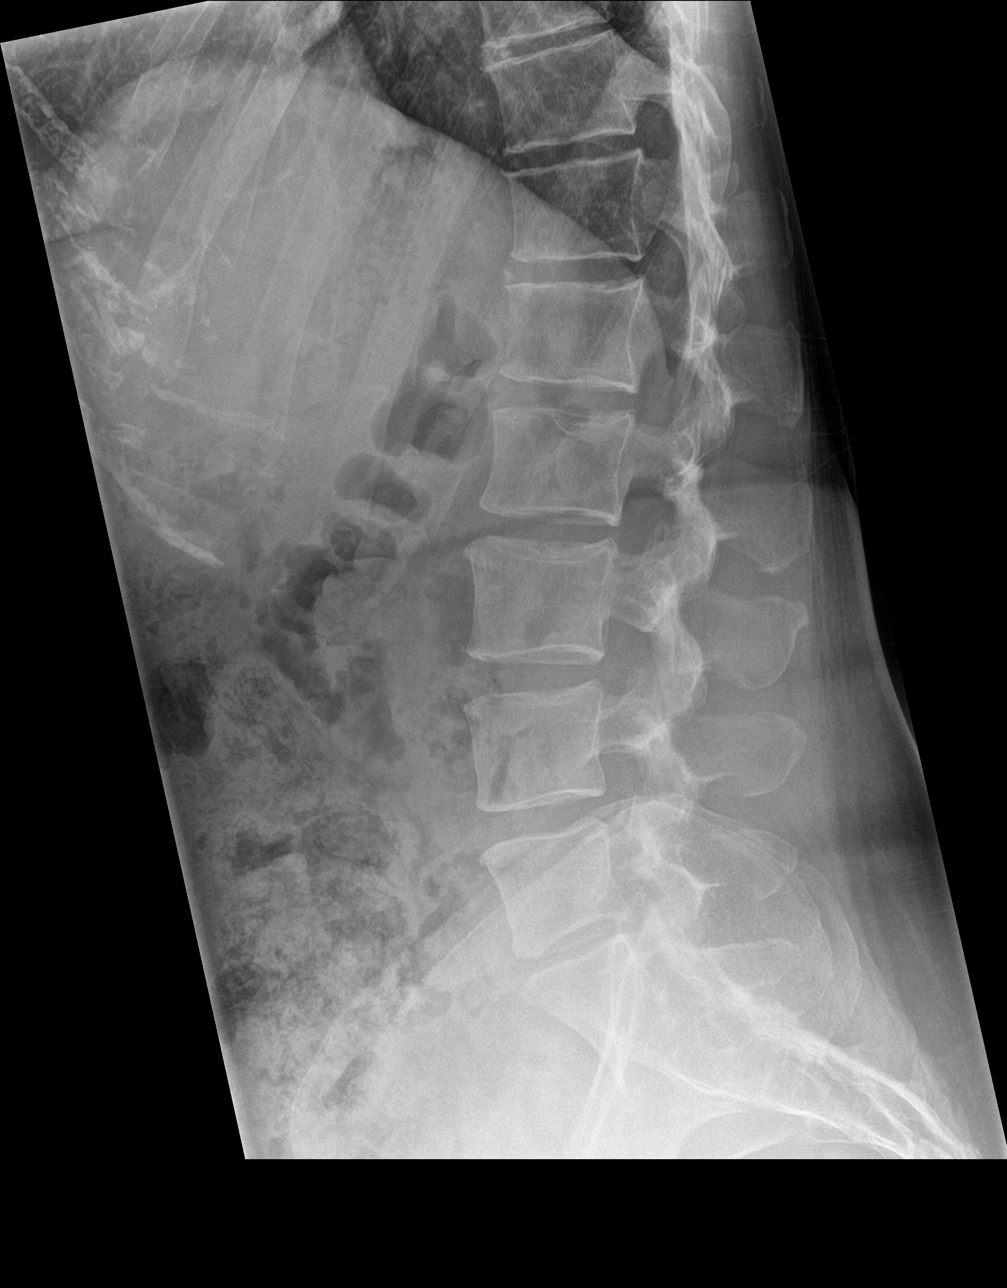

[l-spine spot]
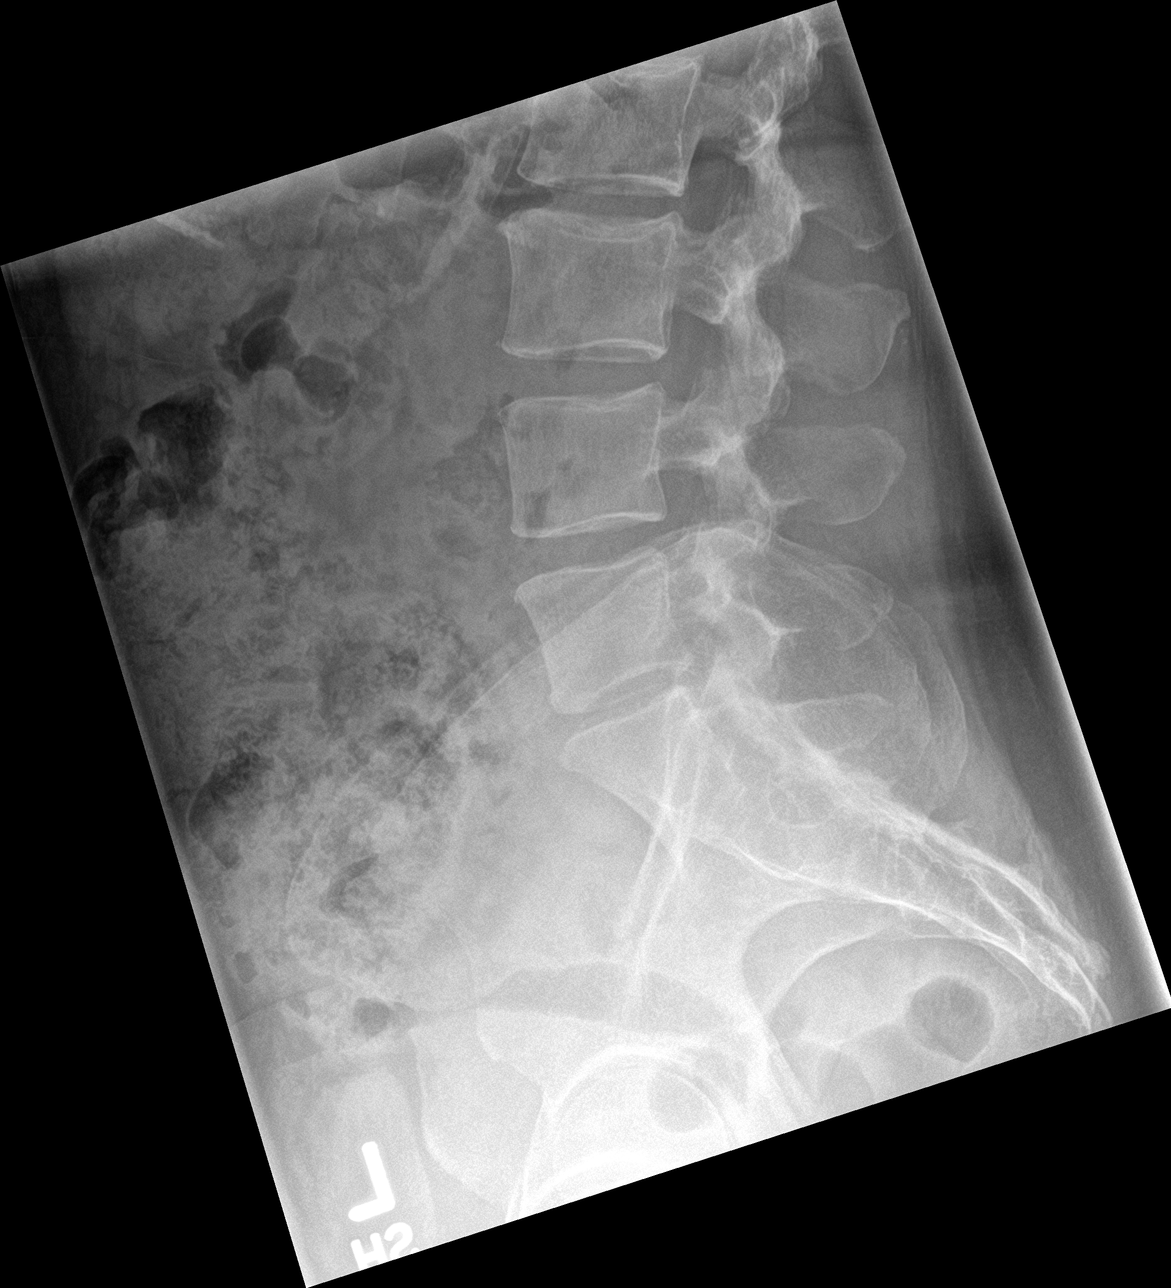

[5 of 5 positions shown; findings below may reference images not displayed]

FINDINGS: Mild dextrocurvature of the lumbar spine. Sagittal alignment within
normal limits. Vertebral body heights appear normal. Mild disc space
narrowing at L5-S1.
IMPRESSION: Mild dextrocurvature of the lumbar spine. Mild degenerative change
at L5-S1.

## 2022-06-30 DIAGNOSIS — E041 Nontoxic single thyroid nodule: Secondary | ICD-10-CM | POA: Diagnosis not present

## 2022-07-11 ENCOUNTER — Ambulatory Visit: Payer: BC Managed Care – PPO | Admitting: Physician Assistant

## 2022-07-20 ENCOUNTER — Ambulatory Visit: Payer: BC Managed Care – PPO | Attending: Physician Assistant

## 2022-07-20 ENCOUNTER — Encounter: Payer: Self-pay | Admitting: Physician Assistant

## 2022-07-20 ENCOUNTER — Ambulatory Visit (INDEPENDENT_AMBULATORY_CARE_PROVIDER_SITE_OTHER): Payer: BC Managed Care – PPO | Admitting: Physician Assistant

## 2022-07-20 VITALS — BP 97/47 | HR 68 | Ht 67.0 in | Wt 176.0 lb

## 2022-07-20 DIAGNOSIS — R002 Palpitations: Secondary | ICD-10-CM

## 2022-07-20 DIAGNOSIS — M9903 Segmental and somatic dysfunction of lumbar region: Secondary | ICD-10-CM | POA: Diagnosis not present

## 2022-07-20 DIAGNOSIS — E785 Hyperlipidemia, unspecified: Secondary | ICD-10-CM

## 2022-07-20 DIAGNOSIS — Z79899 Other long term (current) drug therapy: Secondary | ICD-10-CM | POA: Diagnosis not present

## 2022-07-20 DIAGNOSIS — M9904 Segmental and somatic dysfunction of sacral region: Secondary | ICD-10-CM | POA: Diagnosis not present

## 2022-07-20 DIAGNOSIS — M5459 Other low back pain: Secondary | ICD-10-CM | POA: Diagnosis not present

## 2022-07-20 DIAGNOSIS — M62830 Muscle spasm of back: Secondary | ICD-10-CM | POA: Diagnosis not present

## 2022-07-20 NOTE — Patient Instructions (Signed)
Premature Ventricular Contraction  A premature ventricular contraction (PVC) is a type of irregular heartbeat (arrhythmia). The heart has four chambers, including the upper chambers (atria) and lower chambers (ventricles). Normally, an electrical signal starts in a group of cells called the sinoatrial node (SA node) and travels through the atria, causing them to pump blood into the ventricles. During a PVC, the heartbeat starts in one of the lower ventricles. This may cause the heartbeat to be shorter and less effective. In most cases, PVCs come and go and do not require treatment. What are the causes? Common causes of the condition include: Heart attack or coronary artery disease (CAD). Heart valve problems. Heart surgery. Infection of the heart (myocarditis). Inflammation of the heart. In many cases, the cause of this condition is not known. What increases the risk? The following factors may make you more likely to develop this condition: Age, especially being over age 75. Being female. An imbalance of salts and minerals in the body (electrolytes). Low blood oxygen levels or high carbon dioxide levels. Certain medicines, including over-the-counter and prescribed medicines. High blood pressure. Obesity. Episodes may be triggered by: Vigorous exercise. Tobacco, alcohol, or caffeine use. Illegal drug use. Emotional stress. Poor or irregular sleep. What are the signs or symptoms? The main symptoms of this condition are fast or irregular heartbeats (palpitations) or the feeling of a pause in the heartbeat. Other symptoms include: Shortness of breath. Difficulty exercising. Chest pain. Feeling tired. Dizziness. In some cases, there are no symptoms. How is this diagnosed? This condition may be diagnosed based on: Your medical history or symptoms. A physical exam. Your health care provider may listen to your heart. Tests, such as: Blood tests. An ECG (electrocardiogram) to monitor the  electrical activity of your heart. An ambulatory cardiac monitor that records your heartbeats for 24 hours or more. Stress tests to see how exercise affects your heart rhythm and blood supply. An echocardiogram, which creates an image of your heart. An electrophysiology study (EPS) to check for electrical problems in your heart. How is this treated? Treatment for this condition depends on any underlying conditions, the type of PVC, how many PVCs you have had, and if the symptoms are affecting your daily life. Possible treatments include: Avoiding things that cause PVCs (triggers). These include caffeine, tobacco, and alcohol. Taking medicines if symptoms are severe or if the arrhythmias happen a lot. Getting treatment for underlying conditions that cause PVCs. Having an implantable cardioverter defibrillator (ICD) placed in the chest to monitor the heartbeat. The monitor sends a shock to the heart if it senses an arrhythmia and brings the heartbeat back to normal. Having a catheter ablation procedure to destroy the part of the heart tissue that sends abnormal signals. In many cases, no treatment is required. Follow these instructions at home: Lifestyle  Do not use any products that contain nicotine or tobacco. These products include cigarettes, chewing tobacco, and vaping devices, such as e-cigarettes. If you need help quitting, ask your health care provider. Do not use illegal drugs. Exercise regularly. Ask your health care provider what type of exercise is safe for you. Try to get at least 7-9 hours of sleep each night. Find healthy ways to manage stress. Avoid stressful situations when possible. Alcohol use Do not drink alcohol if: Your health care provider tells you not to drink. You are pregnant, may be pregnant, or are planning to become pregnant. Alcohol triggers your episodes. If you drink alcohol: Limit how much you have to: 0-1   drink a day for women. 0-2 drinks a day for  men. Know how much alcohol is in your drink. In the [place] one drink equals one 12 oz bottle of beer (355 mL), one 5 oz glass of wine (148 mL), or one 1 oz glass of hard liquor (44 mL). General instructions Take over-the-counter and prescription medicines only as told by your health care provider. If caffeine triggers episodes of PVC, do not eat, drink, or use anything with caffeine in it. Contact a health care provider if: You feel palpitations often. You have nausea and vomiting. Get help right away if: You have chest pain. You have trouble breathing. You start sweating for no reason. You become light-headed or you faint. These symptoms may be an emergency. Get help right away. Call 911. Do not wait to see if the symptoms will go away. Do not drive yourself to the hospital. This information is not intended to replace advice given to you by your health care provider. Make sure you discuss any questions you have with your health care provider. Document Revised: 01/28/2022 Document Reviewed: 01/28/2022 Elsevier Patient Education  2023 Elsevier Inc.  

## 2022-07-20 NOTE — Progress Notes (Unsigned)
Enrolled for [employer] to mail a ZIO XT long term holter monitor to the patients address on file.   DOD to read. 

## 2022-07-20 NOTE — Progress Notes (Signed)
Acute Office Visit  Subjective:     Patient ID: Julia Flynn, female    DOB: 1970-11-16, 51 y.o.   MRN: [id number]  Chief Complaint  Patient presents with   Palpitations    HPI Patient is in today for palpitations. She does have history of hypothyroidism. Her TSH was just checked and in normal range. She has had palpitations for years off and on but seemed to get more frequent in the last 6 months. At times she will have episodes every day. She is not having episodes every other day. No known trigger. She has had some chest pressure with them but no pain. Not worse with exertion but worse at night when she is laying down. No medication changes. No supplement changes. No increase in caffeine intake.  .. Active Ambulatory Problems    Diagnosis Date Noted   H/O Hashimoto thyroiditis 07/15/2015   Hypothyroidism 06/09/2010   Abnormal mammogram of left breast 06/04/2015   Well woman exam 07/15/2015   Dyshidrotic eczema 03/07/2016   Seborrheic keratoses 03/07/2016   Mild intermittent asthma 03/13/2019   Allergic rhinitis 03/13/2019   Allergic conjunctivitis 03/13/2019   Dyslipidemia (high LDL; low HDL) 05/17/2019   Allergy 61/95/0932   Hypothyroidism due to Hashimoto's thyroiditis 06/29/2015   Low serum alkaline phosphatase 06/29/2015   Postpartum thyroiditis 06/29/2015   Thyroid nodule 06/29/2015   DDD (degenerative disc disease), lumbar 12/14/2020   Low back pain radiating to right leg 12/14/2020   Palpitations 07/20/2022   Resolved Ambulatory Problems    Diagnosis Date Noted   No Resolved Ambulatory Problems   Past Medical History:  Diagnosis Date   Abnormal mammogram 07/15/2015   Thyroid disease       ROS  See HPI.     Objective:    BP (!) 97/47   Pulse 68   Ht 5\' 7"  (1.702 m)   Wt 176 lb 0.6 oz (79.9 kg)   SpO2 98%   BMI 27.57 kg/m  BP Readings from Last 3 Encounters:  07/20/22 (!) 97/47  04/28/21 (!) 80/60  12/11/20 (!) 95/54   Wt Readings from Last 3  Encounters:  07/20/22 176 lb 0.6 oz (79.9 kg)  12/11/20 168 lb (76.2 kg)  10/28/20 169 lb (76.7 kg)      Physical Exam Constitutional:      Appearance: Normal appearance.  HENT:     Head: Normocephalic.  Neck:     Vascular: No carotid bruit.  Cardiovascular:     Rate and Rhythm: Normal rate and regular rhythm.     Pulses: Normal pulses.     Heart sounds: Normal heart sounds.  Pulmonary:     Effort: Pulmonary effort is normal.     Breath sounds: Normal breath sounds.  Musculoskeletal:     Cervical back: No tenderness.     Right lower leg: No edema.     Left lower leg: No edema.  Lymphadenopathy:     Cervical: No cervical adenopathy.  Neurological:     General: No focal deficit present.     Mental Status: She is alert and oriented to person, place, and time.  Psychiatric:        Mood and Affect: Mood normal.    EKG: NSR. No ST elevation or depression. No acute changes.        Assessment & Plan:  02/18/22Marland KitchenLoreda was seen today for palpitations.  Diagnoses and all orders for this visit:  Palpitations -     COMPLETE METABOLIC PANEL WITH GFR -  CBC with Differential/Platelet -     EKG 12-Lead -     B12 and Folate Panel -     VITAMIN D 25 Hydroxy (Vit-D Deficiency, Fractures) -     Magnesium -     LONG TERM MONITOR (3-14 DAYS); Future  Dyslipidemia (high LDL; low HDL)   Discussed palpitations and triggers Keep diary  EKG reassuring and looks great today Will order heart monitor  Labs ordered today to look for any causes of palpitations Make sure staying hydrated Follow up as needed or symptoms changing   Tandy Gaw, PA-C

## 2022-07-21 LAB — CBC WITH DIFFERENTIAL/PLATELET
Absolute Monocytes: 263 cells/uL (ref 200–950)
Basophils Absolute: 29 cells/uL (ref 0–200)
Basophils Relative: 0.8 %
Eosinophils Absolute: 140 cells/uL (ref 15–500)
Eosinophils Relative: 3.9 %
HCT: 37.5 % (ref 35.0–45.0)
Hemoglobin: 12.4 g/dL (ref 11.7–15.5)
Lymphs Abs: 1022 cells/uL (ref 850–3900)
MCH: 30.3 pg (ref 27.0–33.0)
MCHC: 33.1 g/dL (ref 32.0–36.0)
MCV: 91.7 fL (ref 80.0–100.0)
MPV: 11.1 fL (ref 7.5–12.5)
Monocytes Relative: 7.3 %
Neutro Abs: 2146 cells/uL (ref 1500–7800)
Neutrophils Relative %: 59.6 %
Platelets: 196 10*3/uL (ref 140–400)
RBC: 4.09 10*6/uL (ref 3.80–5.10)
RDW: 13.3 % (ref 11.0–15.0)
Total Lymphocyte: 28.4 %
WBC: 3.6 10*3/uL — ABNORMAL LOW (ref 3.8–10.8)

## 2022-07-21 LAB — COMPLETE METABOLIC PANEL WITH GFR
AG Ratio: 1.6 (calc) (ref 1.0–2.5)
ALT: 5 U/L — ABNORMAL LOW (ref 6–29)
AST: 13 U/L (ref 10–35)
Albumin: 4.4 g/dL (ref 3.6–5.1)
Alkaline phosphatase (APISO): 59 U/L (ref 37–153)
BUN: 12 mg/dL (ref 7–25)
CO2: 25 mmol/L (ref 20–32)
Calcium: 9.1 mg/dL (ref 8.6–10.4)
Chloride: 106 mmol/L (ref 98–110)
Creat: 0.72 mg/dL (ref 0.50–1.03)
Globulin: 2.8 g/dL (calc) (ref 1.9–3.7)
Glucose, Bld: 85 mg/dL (ref 65–99)
Potassium: 4.2 mmol/L (ref 3.5–5.3)
Sodium: 139 mmol/L (ref 135–146)
Total Bilirubin: 0.7 mg/dL (ref 0.2–1.2)
Total Protein: 7.2 g/dL (ref 6.1–8.1)
eGFR: 101 mL/min/{1.73_m2} (ref >=60)

## 2022-07-21 LAB — VITAMIN D 25 HYDROXY (VIT D DEFICIENCY, FRACTURES): Vit D, 25-Hydroxy: 41 ng/mL (ref 30–100)

## 2022-07-21 LAB — MAGNESIUM: Magnesium: 2.5 mg/dL (ref 1.5–2.5)

## 2022-07-21 LAB — B12 AND FOLATE PANEL
Folate: 12.5 ng/mL
Vitamin B-12: 516 pg/mL (ref 200–1100)

## 2022-07-22 DIAGNOSIS — R002 Palpitations: Secondary | ICD-10-CM

## 2022-07-22 NOTE — Progress Notes (Signed)
All labs look great.  WBC better than 1 year ago.

## 2022-07-26 DIAGNOSIS — J301 Allergic rhinitis due to pollen: Secondary | ICD-10-CM | POA: Diagnosis not present

## 2022-07-26 DIAGNOSIS — J3089 Other allergic rhinitis: Secondary | ICD-10-CM | POA: Diagnosis not present

## 2022-08-02 ENCOUNTER — Telehealth: Payer: Self-pay | Admitting: Neurology

## 2022-08-02 NOTE — Telephone Encounter (Signed)
Patient dropped off Health Examination form to be completed, but has not had a physical in a year. She had a problem visit 07/20/2022.   Julia Flynn - Did you want to complete or does she need a physical?

## 2022-08-02 NOTE — Telephone Encounter (Signed)
Form completed and faxed to [phone] with confirmation received. Copy to hold, copy to scan. Patient made aware.

## 2022-08-03 NOTE — Progress Notes (Signed)
Normal sinus rhythm with rare PAC and PVC as discussed in office that was likely. No pathologic arrhythmias.   Follow up as needed.

## 2022-08-23 DIAGNOSIS — J3089 Other allergic rhinitis: Secondary | ICD-10-CM | POA: Diagnosis not present

## 2022-08-23 DIAGNOSIS — J301 Allergic rhinitis due to pollen: Secondary | ICD-10-CM | POA: Diagnosis not present

## 2022-10-04 ENCOUNTER — Ambulatory Visit (INDEPENDENT_AMBULATORY_CARE_PROVIDER_SITE_OTHER): Payer: BC Managed Care – PPO

## 2022-10-04 ENCOUNTER — Ambulatory Visit (INDEPENDENT_AMBULATORY_CARE_PROVIDER_SITE_OTHER): Payer: BC Managed Care – PPO | Admitting: Physician Assistant

## 2022-10-04 ENCOUNTER — Encounter: Payer: Self-pay | Admitting: Physician Assistant

## 2022-10-04 VITALS — BP 100/53 | HR 71 | Ht 67.0 in | Wt 179.1 lb

## 2022-10-04 DIAGNOSIS — M549 Dorsalgia, unspecified: Secondary | ICD-10-CM | POA: Diagnosis not present

## 2022-10-04 DIAGNOSIS — M545 Low back pain, unspecified: Secondary | ICD-10-CM | POA: Diagnosis not present

## 2022-10-04 DIAGNOSIS — M439 Deforming dorsopathy, unspecified: Secondary | ICD-10-CM | POA: Diagnosis not present

## 2022-10-04 MED ORDER — BACLOFEN 10 MG PO TABS: 10.0000 mg | ORAL_TABLET | Freq: Every evening | ORAL | 0 refills | Status: DC | PRN
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
## 2022-10-04 NOTE — Patient Instructions (Addendum)
Tens unit Baclofen  Consider cervical pillow and wedge for sleeping.  Massage

## 2022-10-07 ENCOUNTER — Encounter: Payer: Self-pay | Admitting: Physician Assistant

## 2022-10-07 DIAGNOSIS — M439 Deforming dorsopathy, unspecified: Secondary | ICD-10-CM | POA: Insufficient documentation

## 2022-10-07 NOTE — Progress Notes (Signed)
L1 does have signs of compression and what is causing your pain. I would consider formal PT and see if pain improves. If not consider MRI and other planning.

## 2022-10-07 NOTE — Progress Notes (Signed)
Mild scoliosis. No acute findings in thoracic spine.

## 2022-10-19 ENCOUNTER — Encounter: Payer: Self-pay | Admitting: Physician Assistant

## 2022-11-30 ENCOUNTER — Encounter: Payer: Self-pay | Admitting: Physician Assistant

## 2022-12-08 DIAGNOSIS — M5459 Other low back pain: Secondary | ICD-10-CM | POA: Diagnosis not present

## 2022-12-08 DIAGNOSIS — S32010A Wedge compression fracture of first lumbar vertebra, initial encounter for closed fracture: Secondary | ICD-10-CM | POA: Diagnosis not present

## 2022-12-17 DIAGNOSIS — S32010A Wedge compression fracture of first lumbar vertebra, initial encounter for closed fracture: Secondary | ICD-10-CM | POA: Diagnosis not present

## 2022-12-22 DIAGNOSIS — M5459 Other low back pain: Secondary | ICD-10-CM | POA: Diagnosis not present

## 2022-12-22 DIAGNOSIS — S32010A Wedge compression fracture of first lumbar vertebra, initial encounter for closed fracture: Secondary | ICD-10-CM | POA: Diagnosis not present

## 2022-12-22 DIAGNOSIS — M47816 Spondylosis without myelopathy or radiculopathy, lumbar region: Secondary | ICD-10-CM | POA: Diagnosis not present

## 2022-12-29 DIAGNOSIS — R29898 Other symptoms and signs involving the musculoskeletal system: Secondary | ICD-10-CM | POA: Diagnosis not present

## 2022-12-29 DIAGNOSIS — M47816 Spondylosis without myelopathy or radiculopathy, lumbar region: Secondary | ICD-10-CM | POA: Diagnosis not present

## 2022-12-29 DIAGNOSIS — M439 Deforming dorsopathy, unspecified: Secondary | ICD-10-CM | POA: Diagnosis not present

## 2023-01-19 DIAGNOSIS — M439 Deforming dorsopathy, unspecified: Secondary | ICD-10-CM | POA: Diagnosis not present

## 2023-01-19 DIAGNOSIS — R29898 Other symptoms and signs involving the musculoskeletal system: Secondary | ICD-10-CM | POA: Diagnosis not present

## 2023-01-19 DIAGNOSIS — M47816 Spondylosis without myelopathy or radiculopathy, lumbar region: Secondary | ICD-10-CM | POA: Diagnosis not present

## 2023-05-30 DIAGNOSIS — M609 Myositis, unspecified: Secondary | ICD-10-CM | POA: Diagnosis not present

## 2023-05-30 DIAGNOSIS — M9902 Segmental and somatic dysfunction of thoracic region: Secondary | ICD-10-CM | POA: Diagnosis not present

## 2023-05-30 DIAGNOSIS — M545 Low back pain, unspecified: Secondary | ICD-10-CM | POA: Diagnosis not present

## 2023-05-30 DIAGNOSIS — M9901 Segmental and somatic dysfunction of cervical region: Secondary | ICD-10-CM | POA: Diagnosis not present

## 2023-06-01 DIAGNOSIS — M545 Low back pain, unspecified: Secondary | ICD-10-CM | POA: Diagnosis not present

## 2023-06-01 DIAGNOSIS — M9901 Segmental and somatic dysfunction of cervical region: Secondary | ICD-10-CM | POA: Diagnosis not present

## 2023-06-01 DIAGNOSIS — M609 Myositis, unspecified: Secondary | ICD-10-CM | POA: Diagnosis not present

## 2023-06-01 DIAGNOSIS — M9902 Segmental and somatic dysfunction of thoracic region: Secondary | ICD-10-CM | POA: Diagnosis not present

## 2023-06-05 DIAGNOSIS — M545 Low back pain, unspecified: Secondary | ICD-10-CM | POA: Diagnosis not present

## 2023-06-05 DIAGNOSIS — M609 Myositis, unspecified: Secondary | ICD-10-CM | POA: Diagnosis not present

## 2023-06-05 DIAGNOSIS — M9902 Segmental and somatic dysfunction of thoracic region: Secondary | ICD-10-CM | POA: Diagnosis not present

## 2023-06-05 DIAGNOSIS — M9901 Segmental and somatic dysfunction of cervical region: Secondary | ICD-10-CM | POA: Diagnosis not present

## 2023-06-16 ENCOUNTER — Ambulatory Visit: Payer: BC Managed Care – PPO | Admitting: Physician Assistant

## 2023-06-27 DIAGNOSIS — F411 Generalized anxiety disorder: Secondary | ICD-10-CM | POA: Diagnosis not present

## 2023-06-29 DIAGNOSIS — E063 Autoimmune thyroiditis: Secondary | ICD-10-CM | POA: Diagnosis not present

## 2023-06-29 DIAGNOSIS — E038 Other specified hypothyroidism: Secondary | ICD-10-CM | POA: Diagnosis not present

## 2023-06-29 DIAGNOSIS — E041 Nontoxic single thyroid nodule: Secondary | ICD-10-CM | POA: Diagnosis not present

## 2023-07-12 ENCOUNTER — Ambulatory Visit (INDEPENDENT_AMBULATORY_CARE_PROVIDER_SITE_OTHER): Payer: BC Managed Care – PPO | Admitting: Physician Assistant

## 2023-07-12 ENCOUNTER — Encounter: Payer: Self-pay | Admitting: Physician Assistant

## 2023-07-12 VITALS — BP 116/53 | HR 57 | Ht 67.0 in | Wt 191.0 lb

## 2023-07-12 DIAGNOSIS — Z780 Asymptomatic menopausal state: Secondary | ICD-10-CM | POA: Diagnosis not present

## 2023-07-12 DIAGNOSIS — E034 Atrophy of thyroid (acquired): Secondary | ICD-10-CM | POA: Diagnosis not present

## 2023-07-12 DIAGNOSIS — F411 Generalized anxiety disorder: Secondary | ICD-10-CM | POA: Diagnosis not present

## 2023-07-12 DIAGNOSIS — E785 Hyperlipidemia, unspecified: Secondary | ICD-10-CM | POA: Diagnosis not present

## 2023-07-12 DIAGNOSIS — Z0000 Encounter for general adult medical examination without abnormal findings: Secondary | ICD-10-CM | POA: Diagnosis not present

## 2023-07-12 DIAGNOSIS — Z638 Other specified problems related to primary support group: Secondary | ICD-10-CM | POA: Diagnosis not present

## 2023-07-12 NOTE — Patient Instructions (Signed)

## 2023-07-12 NOTE — Progress Notes (Signed)
Complete physical exam  Patient: Julia Flynn   DOB: December 31, 1970   52 y.o. Female  MRN: [id number]  Subjective:    Chief Complaint  Patient presents with   Annual Exam    Non fasting    Julia Flynn is a 52 y.o. female who presents today for a complete physical exam. She reports consuming a general diet.  Walking 3-5 times a week.  She generally feels well. She reports sleeping well. She does not have additional problems to discuss today.    Most recent fall risk assessment:    10/04/2022    9:01 AM  Fall Risk   Falls in the past year? 0  Number falls in past yr: 0  Injury with Fall? 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   Mid back pain 10/04/2022   Palpitations 07/20/2022   DDD (degenerative disc disease), lumbar 12/14/2020   Low back pain radiating to right leg 12/14/2020   Dyslipidemia (high LDL; low HDL) 05/17/2019   Mild intermittent asthma 03/13/2019   Allergic rhinitis 03/13/2019   Allergic conjunctivitis 03/13/2019   Dyshidrotic eczema 03/07/2016   Seborrheic keratoses 03/07/2016   H/O Hashimoto thyroiditis 07/15/2015   Well woman exam 07/15/2015   Hypothyroidism due to Hashimoto's thyroiditis 06/29/2015   Low serum alkaline phosphatase 06/29/2015   Postpartum thyroiditis 06/29/2015   Thyroid nodule 06/29/2015   Abnormal mammogram of left breast 06/04/2015   Hypothyroidism 06/09/2010   Allergy 06/09/2010   Past Medical History:  Diagnosis Date   Abnormal mammogram 07/15/2015   BIRADS-05/25/2015, following every 6 months to follow califications   H/O Hashimoto thyroiditis 07/15/2015    Hypothyroidism 07/15/2015   Thyroid disease    Well woman exam 07/15/2015   Pap NILM, HPV neg 2014   Past Surgical History:  Procedure Laterality Date   ADENOIDECTOMY  106   CESAREAN SECTION N/A 1999-2001-2007-2011   Family History  Problem Relation Age of Onset   Allergic rhinitis Son    Asthma Neg Hx    Eczema Neg Hx    Urticaria Neg Hx    Immunodeficiency Neg Hx    Angioedema Neg Hx    Allergies  Allergen Reactions   Morphine Itching   Latex Dermatitis and Itching      Patient Care Team: Julia Flynn as PCP - General (Family Medicine)   Outpatient Medications Prior to Visit  Medication Sig   Crisaborole (EUCRISA) 2 % OINT Apply to affected area twice a day as needed   EPINEPHrine (AUVI-Q) 0.3 mg/0.3 mL IJ SOAJ injection Inject 0.3 mLs (0.3 mg total) into the muscle as needed for anaphylaxis.   levocetirizine (XYZAL) 5 MG tablet Take 5 mg by mouth every evening.   levothyroxine (SYNTHROID) 100 MCG tablet TAKE 1 TABLET DAILY   NON FORMULARY Allergen immunotherapy   [DISCONTINUED] baclofen (LIORESAL) 10 MG tablet Take 1 tablet (10 mg total) by mouth at bedtime as needed for muscle spasms.   No facility-administered medications prior to visit.    Review of Systems  All other systems reviewed  and are negative.         Objective:     BP (!) 116/53   Pulse (!) 57   Ht 5\' 7"  (1.702 m)   Wt 191 lb (86.6 kg)   SpO2 99%   BMI 29.91 kg/m  BP Readings from Last 3 Encounters:  07/12/23 (!) 116/53  10/04/22 (!) 100/53  07/20/22 (!) 97/47   Wt Readings from Last 3 Encounters:  07/12/23 191 lb (86.6 kg)  10/04/22 179 lb 1.9 oz (81.2 kg)  07/20/22 176 lb 0.6 oz (79.9 kg)      Physical Exam  BP (!) 116/53   Pulse (!) 57   Ht 5\' 7"  (1.702 m)   Wt 191 lb (86.6 kg)   SpO2 99%   BMI 29.91 kg/m   General Appearance:    Alert, cooperative, no distress, appears stated age  Head:    Normocephalic, without obvious abnormality, atraumatic  Eyes:     PERRL, conjunctiva/corneas clear, EOM's intact, fundi    benign, both eyes  Ears:    Normal TM's and external ear canals, both ears  Nose:   Nares normal, septum midline, mucosa normal, no drainage    or sinus tenderness  Throat:   Lips, mucosa, and tongue normal; teeth and gums normal  Neck:   Supple, symmetrical, trachea midline, no adenopathy;    thyroid:  no enlargement/tenderness/nodules; no carotid   bruit or JVD  Back:     Symmetric, no curvature, ROM normal, no CVA tenderness  Lungs:     Clear to auscultation bilaterally, respirations unlabored  Chest Wall:    No tenderness or deformity   Heart:    Regular rate and rhythm, S1 and S2 normal, no murmur, rub   or gallop     Abdomen:     Soft, non-tender, bowel sounds active all four quadrants,    no masses, no organomegaly        Extremities:   Extremities normal, atraumatic, no cyanosis or edema  Pulses:   2+ and symmetric all extremities  Skin:   Skin color, texture, turgor normal, no rashes or lesions  Lymph nodes:   Cervical, supraclavicular, and axillary nodes normal  Neurologic:   CNII-XII intact, normal strength, sensation and reflexes    throughout      Assessment & Plan:    Routine Health Maintenance and Physical Exam  Immunization History  Administered Date(s) Administered   Tdap 01/29/2013    Health Maintenance  Topic Date Due   Cervical Cancer Screening (HPV/Pap Cotest)  Never done   DTaP/Tdap/Td (2 - Td or Tdap) 01/30/2023   COVID-19 Vaccine (1) 07/28/2023 (Originally 07/16/1975)   MAMMOGRAM  10/05/2023 (Originally 12/16/2021)   Hepatitis C Screening  10/05/2023 (Originally 07/15/1988)   Zoster Vaccines- Shingrix (1 of 2) 10/12/2023 (Originally 07/15/1989)   INFLUENZA VACCINE  12/11/2023 (Originally 04/13/2023)   Colonoscopy  09/02/2031   HIV Screening  Completed   HPV VACCINES  Aged Out    Discussed health benefits of physical activity, and encouraged her to engage in regular exercise appropriate for her  age and condition.  Marland KitchenBenna Flynn was seen today for annual exam.  Diagnoses and all orders for this visit:  Encounter for annual physical exam -     CMP14+EGFR -     CBC w/Diff/Platelet -     Lipid panel -     Cancel: TSH + free T4 -     Vitamin D (25 hydroxy) -     B12 and  Folate Panel  Hypothyroidism due to acquired atrophy of thyroid -     Cancel: TSH + free T4  Dyslipidemia (high LDL; low HDL) -     Lipid panel  Post-menopausal -     Vitamin D (25 hydroxy) -     B12 and Folate Panel  .Marland Kitchen Discussed 150 minutes of exercise a week.  Encouraged vitamin D 1000 units and Calcium 1300mg  or 4 servings of dairy a day.  PHQ no concerns Fasting labs order minus thyroid since managed by endocrinology Encouraged patient to schedule pap Mammogram UTD Colonoscopy UTD Declined tdap/shingles/flu today      Tandy Gaw, PA-C

## 2023-07-25 ENCOUNTER — Encounter: Payer: Self-pay | Admitting: Physician Assistant

## 2023-07-25 ENCOUNTER — Ambulatory Visit (INDEPENDENT_AMBULATORY_CARE_PROVIDER_SITE_OTHER): Payer: BC Managed Care – PPO | Admitting: Physician Assistant

## 2023-07-25 VITALS — BP 102/67 | HR 78 | Ht 67.0 in | Wt 190.0 lb

## 2023-07-25 DIAGNOSIS — B070 Plantar wart: Secondary | ICD-10-CM

## 2023-07-25 NOTE — Patient Instructions (Signed)
Plantar Warts Warts are small growths on the skin. When they happen on the bottom of the foot (sole), they are called plantar warts. Most warts are not painful and do not cause problems. In some cases, plantar warts may cause pain when you walk. They can also spread to other parts of your body. Warts often go away on their own. Treatment may be done if needed. What are the causes? Plantar warts are caused by a germ called human papillomavirus (HPV). You may get HPV if: You walk barefoot. The risk is higher if your feet are wet. You have a break in the skin of your foot. What increases the risk? Being between 46 and 53 years of age. Using public showers or locker rooms. Having a weak body defense system (immune system). What are the signs or symptoms?  Flat or slightly raised growths. They may have a rough surface. They may look like a callus. Pain when you stand or walk on your foot. How is this treated? In many cases, warts do not need treatment. They may go away on their own with time. If treatment is needed or wanted, it may include: Putting solutions, creams, or patches with medicine in them on the wart. Freezing the wart with liquid nitrogen. Burning the wart with: Laser treatment. An electrified probe. Putting a medicine into the wart to help your immune system fight off the wart. Having surgery to remove the wart. Putting duct tape over the top of the wart. You will leave the tape in place for as long as told by your doctor. Then you will replace it with a new strip of tape. This is done until the wart goes away. You may need repeat treatment. In some cases, warts may go away and come back again. Follow these instructions at home: General instructions Put on creams or solutions only as told by your doctor. If told by your doctor: Soak your foot in warm water. Remove the top layer of softened skin before you put the medicine on. You can use a pumice stone to remove the  skin. After you put the medicine on, put a bandage over the area of the wart. Repeat the process every day or as told by your doctor. Do not scratch or pick at a wart. Wash your hands after you touch a wart. If a wart hurts, cover it with a bandage that has a hole in the middle. Keep all follow-up visits. You may need some treatments more than once. How is this prevented?  Wear shoes and socks. Change your socks every day. Keep your feet clean and dry. Do not walk barefoot in: [place]oteet rooms. Shower areas. Swimming pools. Check your feet often. Avoid direct contact with warts on other people. Contact a doctor if: Your warts do not get better with treatment. You have redness, swelling, or pain at the site of a wart. You have bleeding from a wart that does not stop when you put light pressure on the wart. You have diabetes and you get a wart. This information is not intended to replace advice given to you by your health care provider. Make sure you discuss any questions you have with your health care provider. Document Revised: 09/13/2022 Document Reviewed: 09/13/2022 Elsevier Patient Education  2024 [employer].

## 2023-07-25 NOTE — Progress Notes (Signed)
Established Patient Office Visit  Subjective   Patient ID: Julia Flynn, female    DOB: 1971-08-07  Age: 52 y.o. MRN: [id number]  Chief Complaint  Patient presents with   Wart removal    HPI Pt presents to the clinic to have warts frozen off the bottom of both feet. She has had frozen a few years ago but came back. They are bothersome at times but not all the time. She has had these for at least a year.   .. 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    Objective:     BP 102/67 (BP Location: Left Arm, Patient Position: Sitting, Cuff Size: Large)   Pulse 78   Ht 5\' 7"  (1.702 m)   Wt 190 lb (86.2 kg)   SpO2 96%   BMI 29.76 kg/m  BP Readings from Last 3 Encounters:  07/25/23 102/67  07/12/23 (!) 116/53  10/04/22 (!) 100/53   Wt Readings from Last 3 Encounters:  07/25/23 190 lb (86.2 kg)  07/12/23 191 lb (86.6 kg)  10/04/22 179 lb 1.9 oz (81.2 kg)      Physical Exam Right plantar surface 3 verruca.  Left  plantar surface 1 verruca.   Cryotherapy Procedure Note  Pre-operative Diagnosis: wart  Post-operative Diagnosis: wart  Locations: bilateral plantar surface  Indications: pain and irritation  Procedure Details  History of allergy to iodine: no. Pacemaker? no.  Patient informed of risks (permanent scarring, infection, light or dark discoloration, bleeding, infection, weakness, numbness and recurrence of the lesion) and benefits of the procedure and verbal informed consent obtained.  The areas are treated with liquid nitrogen therapy, frozen until ice ball extended 2 mm beyond lesion, allowed to thaw, and treated again. The patient tolerated procedure well.  The patient was instructed on post-op care, warned that there may be blister formation, redness and pain. Recommend OTC analgesia as needed for pain.  Condition: Stable  Complications: none.  Plan: 1. Instructed to keep the area dry and covered for 24-48h and clean thereafter. 2. Warning signs of infection were reviewed.   3. RTC in 2 weeks if not improving for next treatment.     The 10-year ASCVD risk score (Arnett DK, et al., 2019) is: 1.3%    Assessment & Plan:  Marland KitchenMarland KitchenQianna was seen today for wart removal.  Diagnoses and all orders for this visit:  Plantar warts   HO given Cryotherapy done today without complications Follow up as needed or in 2 weeks for follow up.   Tandy Gaw,  PA-C

## 2023-07-26 DIAGNOSIS — F411 Generalized anxiety disorder: Secondary | ICD-10-CM | POA: Diagnosis not present

## 2023-07-26 DIAGNOSIS — Z638 Other specified problems related to primary support group: Secondary | ICD-10-CM | POA: Diagnosis not present

## 2023-08-01 DIAGNOSIS — Z638 Other specified problems related to primary support group: Secondary | ICD-10-CM | POA: Diagnosis not present

## 2023-08-01 DIAGNOSIS — F411 Generalized anxiety disorder: Secondary | ICD-10-CM | POA: Diagnosis not present

## 2023-08-16 DIAGNOSIS — F411 Generalized anxiety disorder: Secondary | ICD-10-CM | POA: Diagnosis not present

## 2023-08-16 DIAGNOSIS — Z638 Other specified problems related to primary support group: Secondary | ICD-10-CM | POA: Diagnosis not present

## 2023-09-01 DIAGNOSIS — Z638 Other specified problems related to primary support group: Secondary | ICD-10-CM | POA: Diagnosis not present

## 2023-09-01 DIAGNOSIS — F411 Generalized anxiety disorder: Secondary | ICD-10-CM | POA: Diagnosis not present

## 2023-09-22 ENCOUNTER — Encounter: Payer: Self-pay | Admitting: Physician Assistant

## 2023-09-22 ENCOUNTER — Ambulatory Visit (INDEPENDENT_AMBULATORY_CARE_PROVIDER_SITE_OTHER): Payer: BC Managed Care – PPO | Admitting: Physician Assistant

## 2023-09-22 VITALS — BP 113/78 | HR 77 | Ht 67.0 in | Wt 192.0 lb

## 2023-09-22 DIAGNOSIS — B070 Plantar wart: Secondary | ICD-10-CM | POA: Diagnosis not present

## 2023-09-22 NOTE — Progress Notes (Signed)
   Established Patient Office Visit  Subjective   Patient ID: Julia Flynn, female    DOB: Jun 29, 1971  Age: 53 y.o. MRN: [id number]  Chief Complaint  Patient presents with   Medical Management of Chronic Issues    Refreeze wartz     HPI Pt presents to the follow up on plantar warts bilateral feet. Improved and almost gone since last treatment. She would like to freeze another time.       Objective:     BP 113/78 (BP Location: Left Arm, Cuff Size: Normal)   Pulse 77   Ht 5' 7 (1.702 m)   Wt 192 lb (87.1 kg)   SpO2 99%   BMI 30.07 kg/m  BP Readings from Last 3 Encounters:  09/22/23 113/78  07/25/23 102/67  07/12/23 (!) 116/53   Wt Readings from Last 3 Encounters:  09/22/23 192 lb (87.1 kg)  07/25/23 190 lb (86.2 kg)  07/12/23 191 lb (86.6 kg)    Cryotherapy Procedure Note  Pre-operative Diagnosis: plantar wart  Post-operative Diagnosis: same  Locations: ball of bilateral feet  Indications: irritation and pain  Procedure Details  History of allergy to iodine: no. Pacemaker? no.  Patient informed of risks (permanent scarring, infection, light or dark discoloration, bleeding, infection, weakness, numbness and recurrence of the lesion) and benefits of the procedure and verbal informed consent obtained.  The areas are treated with liquid nitrogen therapy, frozen until ice ball extended 2mm beyond lesion, allowed to thaw, and treated again. The patient tolerated procedure well.  The patient was instructed on post-op care, warned that there may be blister formation, redness and pain. Recommend OTC analgesia as needed for pain.  Condition: Stable  Complications: none.  Plan: 1. Instructed to keep the area dry and covered for 24-48h and clean thereafter. 2. Warning signs of infection were reviewed.   3. Recommended that the patient use OTC acetaminophen as needed for pain.    Physical Exam 4 very small plantar warts bilateral feet on ball.     Assessment &  Plan:  [name] was seen today for medical management of chronic issues.  Diagnoses and all orders for this visit:  Plantar warts   Cryotherapy done today  Discussed once healed if any remainder use pummel stone and epson water soaks Follow up as needed   Rebecca Cairns, PA-C

## 2023-12-28 DIAGNOSIS — J3089 Other allergic rhinitis: Secondary | ICD-10-CM | POA: Diagnosis not present

## 2023-12-28 DIAGNOSIS — L309 Dermatitis, unspecified: Secondary | ICD-10-CM | POA: Diagnosis not present

## 2023-12-28 DIAGNOSIS — J301 Allergic rhinitis due to pollen: Secondary | ICD-10-CM | POA: Diagnosis not present

## 2024-02-29 DIAGNOSIS — Z91018 Allergy to other foods: Secondary | ICD-10-CM | POA: Diagnosis not present

## 2024-02-29 DIAGNOSIS — Z0000 Encounter for general adult medical examination without abnormal findings: Secondary | ICD-10-CM | POA: Diagnosis not present

## 2024-02-29 DIAGNOSIS — E034 Atrophy of thyroid (acquired): Secondary | ICD-10-CM | POA: Diagnosis not present

## 2024-02-29 DIAGNOSIS — L309 Dermatitis, unspecified: Secondary | ICD-10-CM | POA: Diagnosis not present

## 2024-02-29 DIAGNOSIS — Z780 Asymptomatic menopausal state: Secondary | ICD-10-CM | POA: Diagnosis not present

## 2024-02-29 DIAGNOSIS — E785 Hyperlipidemia, unspecified: Secondary | ICD-10-CM | POA: Diagnosis not present

## 2024-03-01 ENCOUNTER — Ambulatory Visit: Payer: Self-pay | Admitting: Physician Assistant

## 2024-03-01 LAB — LIPID PANEL
Chol/HDL Ratio: 4.3 ratio (ref 0.0–4.4)
Cholesterol, Total: 237 mg/dL — ABNORMAL HIGH (ref 100–199)
HDL: 55 mg/dL (ref >39)
LDL Chol Calc (NIH): 171 mg/dL — ABNORMAL HIGH (ref 0–99)
Triglycerides: 63 mg/dL (ref 0–149)
VLDL Cholesterol Cal: 11 mg/dL (ref 5–40)

## 2024-03-01 LAB — CBC WITH DIFFERENTIAL/PLATELET
Basophils Absolute: 0 10*3/uL (ref 0.0–0.2)
Basos: 0 %
EOS (ABSOLUTE): 0.1 10*3/uL (ref 0.0–0.4)
Eos: 4 %
Hematocrit: 40.8 % (ref 34.0–46.6)
Hemoglobin: 13.2 g/dL (ref 11.1–15.9)
Immature Grans (Abs): 0 10*3/uL (ref 0.0–0.1)
Immature Granulocytes: 0 %
Lymphocytes Absolute: 1 10*3/uL (ref 0.7–3.1)
Lymphs: 31 %
MCH: 30.5 pg (ref 26.6–33.0)
MCHC: 32.4 g/dL (ref 31.5–35.7)
MCV: 94 fL (ref 79–97)
Monocytes Absolute: 0.2 10*3/uL (ref 0.1–0.9)
Monocytes: 7 %
Neutrophils Absolute: 1.8 10*3/uL (ref 1.4–7.0)
Neutrophils: 58 %
Platelets: 163 10*3/uL (ref 150–450)
RBC: 4.33 x10E6/uL (ref 3.77–5.28)
RDW: 14 % (ref 11.7–15.4)
WBC: 3.1 10*3/uL — ABNORMAL LOW (ref 3.4–10.8)

## 2024-03-01 LAB — CMP14+EGFR
ALT: 13 IU/L (ref 0–32)
AST: 17 IU/L (ref 0–40)
Albumin: 4.5 g/dL (ref 3.8–4.9)
Alkaline Phosphatase: 45 IU/L (ref 44–121)
BUN/Creatinine Ratio: 18 (ref 9–23)
BUN: 13 mg/dL (ref 6–24)
Bilirubin Total: 0.5 mg/dL (ref 0.0–1.2)
CO2: 23 mmol/L (ref 20–29)
Calcium: 9.5 mg/dL (ref 8.7–10.2)
Chloride: 104 mmol/L (ref 96–106)
Creatinine, Ser: 0.74 mg/dL (ref 0.57–1.00)
Globulin, Total: 2.8 g/dL (ref 1.5–4.5)
Glucose: 90 mg/dL (ref 70–99)
Potassium: 4.3 mmol/L (ref 3.5–5.2)
Sodium: 142 mmol/L (ref 134–144)
Total Protein: 7.3 g/dL (ref 6.0–8.5)
eGFR: 97 mL/min/{1.73_m2} (ref >59)

## 2024-03-01 LAB — B12 AND FOLATE PANEL
Folate: 13.1 ng/mL (ref >3.0)
Vitamin B-12: 631 pg/mL (ref 232–1245)

## 2024-03-01 LAB — VITAMIN D 25 HYDROXY (VIT D DEFICIENCY, FRACTURES): Vit D, 25-Hydroxy: 46 ng/mL (ref 30.0–100.0)

## 2024-03-01 NOTE — Progress Notes (Signed)
 Julia Flynn   B12 and folate look great.  Vitamin D  looks good.  Kidney, liver, glucose look good.  HDL, good cholesterol, to goal.  LDL, bad cholesterol, elevated.   10 year cardiovascular risk is 1.6 percent and overall good. I would start red yeast rice and make sure keeping a low fat diet. Recheck in one year. If staying elevated consider starting a statin to decrease LDL.    Aaron Aas.The 10-year ASCVD risk score (Arnett DK, et al., 2019) is: 1.6%   Values used to calculate the score:     Age: 53 years     Clincally relevant sex: Female     Is Non-Hispanic African American: No     Diabetic: No     Tobacco smoker: No     Systolic Blood Pressure: 113 mmHg     Is BP treated: No     HDL Cholesterol: 55 mg/dL     Total Cholesterol: 237 mg/dL

## 2024-04-26 DIAGNOSIS — W57XXXA Bitten or stung by nonvenomous insect and other nonvenomous arthropods, initial encounter: Secondary | ICD-10-CM | POA: Diagnosis not present

## 2024-04-26 DIAGNOSIS — S30861A Insect bite (nonvenomous) of abdominal wall, initial encounter: Secondary | ICD-10-CM | POA: Diagnosis not present

## 2024-06-05 DIAGNOSIS — R399 Unspecified symptoms and signs involving the genitourinary system: Secondary | ICD-10-CM | POA: Diagnosis not present

## 2024-06-05 DIAGNOSIS — R300 Dysuria: Secondary | ICD-10-CM | POA: Diagnosis not present

## 2024-07-08 DIAGNOSIS — E063 Autoimmune thyroiditis: Secondary | ICD-10-CM | POA: Diagnosis not present

## 2024-07-08 DIAGNOSIS — E041 Nontoxic single thyroid nodule: Secondary | ICD-10-CM | POA: Diagnosis not present

## 2024-08-12 ENCOUNTER — Ambulatory Visit: Admitting: Physician Assistant

## 2024-08-12 VITALS — BP 132/59 | HR 63 | Ht 67.0 in | Wt 184.0 lb

## 2024-08-12 DIAGNOSIS — E663 Overweight: Secondary | ICD-10-CM

## 2024-08-12 DIAGNOSIS — Z91018 Allergy to other foods: Secondary | ICD-10-CM

## 2024-08-12 DIAGNOSIS — E063 Autoimmune thyroiditis: Secondary | ICD-10-CM

## 2024-08-12 DIAGNOSIS — N951 Menopausal and female climacteric states: Secondary | ICD-10-CM | POA: Diagnosis not present

## 2024-08-12 MED ORDER — ESTRADIOL-NORETHINDRONE ACET 0.5-0.1 MG PO TABS: 1.0000 | ORAL_TABLET | Freq: Every day | ORAL | 2 refills | Status: DC

## 2024-08-12 NOTE — Patient Instructions (Signed)
 Stop licorice root Start activella Get labs today Need to schedule Mammogram  Perimenopause: What to Know Perimenopause is the time in your life when your levels of estrogen start to go down. Estrogen is the female hormone made by your ovaries. Perimenopause can start 2-8 years before menopause. It can cause changes to your menstrual period. During this time, your ovaries may or may not make an egg. In many cases, you can still get pregnant. What are the causes? Perimenopause is a natural change in your homone levels that happens as you get older. What increases the risk? You're more likely to start perimenopause early if: You have an abnormal growth (tumor) of the pituitary gland in your brain. You have a disease that affects your ovaries. You've had certain treatments for cancer. These include: Chemotherapy. Hormone therapy. Radiation therapy on the area between your hips (pelvis). You smoke a lot or drink a lot of alcohol. Other family members have gone through menopause early. What are the signs or symptoms? Symptoms are unique to each person. You may have: Hot flashes. Irregular periods. Night sweats. Changes in how you feel about sex. You may have less of a sex drive or feel more discomfort around your sexuality. Vaginal dryness. Headaches. Mood swings. Other symptoms may include: Depression. This is when you feel sad or hopeless. Trouble sleeping. Memory problems or trouble focusing. Irritability. This means getting annoyed easily. Tiredness. Weight gain. Anxiety. This is feeling worried or nervous. You can also have trouble getting pregnant. How is this diagnosed? You may be diagnosed based on: Your medical history. An exam. Your age. Your history of menstrual periods. Your symptoms. Hormone tests. How is this treated? In some cases, no treatment is needed. Talk with your health care provider about if you should get treated. Treatments may include: Menopausal  hormone therapy (MHT). Medicines to treat certain symptoms. Acupuncture. Vitamin or herbal supplements. Before you start treatment, let your provider know if you or anyone in your family has or has had: Heart disease. Breast cancer. Blood clots. Diabetes. Osteoporosis. Follow these instructions at home: Eating and drinking  Eat a balanced diet. It should include: Fresh fruits and vegetables. Whole grains. Soybeans. Eggs. Lean meat. Low-fat dairy. To help prevent hot flashes, stay away from: Alcohol. Drinks with caffeine in them. Spicy foods. Lifestyle Do not smoke, vape, or use nicotine or tobacco. Get at least 30 minutes of physical activity on 5 or more days each week. Get 7-8 hours of sleep each night. Dress in layers that can be taken off if you have a hot flash. Find ways to manage stress. You may want to try: Deep breathing. Meditation. Writing in a journal. General instructions  Take your medicines only as told. Keep track of your periods. Track: When they happen. How heavy they are. How long they last. How much time passes between periods. Keep track of your symptoms. Track: When they start. How often you have them. How long they last. Use vaginal lubricants or moisturizers. These can help with: Vaginal dryness. Comfort during sex. You can still get pregnant if you're having any periods. Make sure you use birth control if you don't want to get pregnant. Contact a health care provider if: You have a very heavy period or pass blood clots. Your period lasts more than 2 days longer than normal. Your period comes back sooner than 21 days. You bleed after having sex. You have pain during sex. You have pain when you pee. You get very bad  headaches. You have trouble with your eyesight. Get help right away if: You have chest pain. You have trouble breathing. You have trouble talking. You have very bad depression. This information is not intended to  replace advice given to you by your health care provider. Make sure you discuss any questions you have with your health care provider. Document Revised: 05/04/2023 Document Reviewed: 05/04/2023 Elsevier Patient Education  2024 [employer].

## 2024-08-13 ENCOUNTER — Ambulatory Visit: Payer: Self-pay | Admitting: Physician Assistant

## 2024-08-13 LAB — CMP14+EGFR
ALT: 16 IU/L (ref 0–32)
AST: 24 IU/L (ref 0–40)
Albumin: 4.3 g/dL (ref 3.8–4.9)
Alkaline Phosphatase: 55 IU/L (ref 49–135)
BUN/Creatinine Ratio: 15 (ref 9–23)
BUN: 10 mg/dL (ref 6–24)
Bilirubin Total: 0.8 mg/dL (ref 0.0–1.2)
CO2: 20 mmol/L (ref 20–29)
Calcium: 9.1 mg/dL (ref 8.7–10.2)
Chloride: 105 mmol/L (ref 96–106)
Creatinine, Ser: 0.68 mg/dL (ref 0.57–1.00)
Globulin, Total: 2.8 g/dL (ref 1.5–4.5)
Glucose: 83 mg/dL (ref 70–99)
Potassium: 4.4 mmol/L (ref 3.5–5.2)
Sodium: 141 mmol/L (ref 134–144)
Total Protein: 7.1 g/dL (ref 6.0–8.5)
eGFR: 103 mL/min/1.73 (ref >59)

## 2024-08-13 LAB — TSH+FREE T4
Free T4: 1.29 ng/dL (ref 0.82–1.77)
TSH: 0.468 u[IU]/mL (ref 0.450–4.500)

## 2024-08-13 LAB — FSH/LH
FSH: 40.8 m[IU]/mL
LH: 38.7 m[IU]/mL

## 2024-08-13 LAB — PROGESTERONE: Progesterone: 0.8 ng/mL

## 2024-08-13 LAB — ESTRADIOL: Estradiol: 94.9 pg/mL

## 2024-08-13 NOTE — Progress Notes (Signed)
 Lynzy,   Thyroid  normal.  Kidney, liver, glucose normal.   Your estrogen and progesterone level is still pretty good but you FSH is showing that you are in perimenopause with FSH above 25. If you start the activella then recheck levels to make sure not going too high in next 3 months.

## 2024-08-13 NOTE — Progress Notes (Unsigned)
 Established Patient Office Visit  Subjective   Patient ID: Julia Flynn, female    DOB: [birth year]  Age: 53 y.o. MRN: [id number]   HPI .SABRADiscussed the use of AI scribe software for clinical note transcription with the patient, who gave verbal consent to proceed.  History of Present Illness Julia Flynn is a 53 year old female who presents with concerns about weight gain and hormone replacement therapy.  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 - Dairy intolerance identified; both wheat and dairy eliminated from diet since June 2025 - Initial weight loss of approximately ten pounds since January 2025 after dietary changes  Perimenopausal symptoms - Hot flashes - Sleep disturbances attributed to perimenopausal state - Vaginal dryness - Menstrual cycles occurring approximately every five months; last cycle five months ago - No prior use of birth control or hormone therapy - Retains all reproductive organs  Sleep disturbance - Poor sleep quality for a prolonged period - Sleep disruption attributed to perimenopausal symptoms  Gastrointestinal symptoms and dietary management - History of irritable bowel syndrome (IBS) - Concern for 'leaky gut' - Gluten-free and dairy-free diet since June 2025 to support gut health - Avoidance of interventions that may exacerbate gastrointestinal symptoms  Physical activity - Unable to maintain regular exercise routine due to 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 - Disrupted exercise routine contributing to difficulty with weight management  Current medications and supplements - Levothyroxine - Eucrisa  as needed for skin issues - Xyzal  for allergies - Use of supplements    ROS See HPI    Objective:     BP (!) 132/59   Pulse 63   Ht 5' 7 (1.702 m)   Wt 184 lb (83.5 kg)   SpO2 99%   BMI 28.82 kg/m  BP  Readings from Last 3 Encounters:  08/12/24 (!) 132/59  09/22/23 113/78  07/25/23 102/67   Wt Readings from Last 3 Encounters:  08/12/24 184 lb (83.5 kg)  09/22/23 192 lb (87.1 kg)  07/25/23 190 lb (86.2 kg)      Physical Exam Constitutional:      Appearance: Normal appearance.  HENT:     Head: Normocephalic.  Cardiovascular:     Rate and Rhythm: Normal rate and regular rhythm.  Pulmonary:     Effort: Pulmonary effort is normal.     Breath sounds: Normal breath sounds.  Musculoskeletal:     Right lower leg: No edema.     Left lower leg: No edema.  Neurological:     General: No focal deficit present.     Mental Status: She is alert and oriented to person, place, and time.  Psychiatric:        Mood and Affect: Mood normal.      The 10-year ASCVD risk score (Arnett DK, et al., 2019) is: 2.3%    Assessment & Plan:  .Diagnoses and all orders for this visit:  Perimenopausal symptoms -     Estradiol -     Progesterone -     FSH/LH -     Estradiol-Norethindrone Acet 0.5-0.1 MG tablet; Take 1 tablet by mouth daily.  Hypothyroidism due to Hashimoto's thyroiditis -     TSH + free T4 -     CMP14+EGFR  Overweight (BMI 25.0-29.9)   Assessment & Plan Perimenopausal symptoms including sleep disturbance and vaginal dryness HRT can alleviate symptoms but not aid weight loss. Risks include  increased breast cancer and blood clots. Non-hormonal options include Veozah for hot flashes and antidepressants for mood. Topical estrogen for vaginal dryness. She prefers to avoid gut-affecting treatments due to suspected leaky gut and IBS. - Initiated low-dose oral HRT with estrogen and progesterone. - Monitor symptoms and adjust dosage as needed. - Check hormone levels in two months. - Consider topical estrogen for vaginal dryness if needed. - Risk Factors discussed such as increased CV and Breast cancer risk - Encouraged to schedule mammogram  Hypothyroidism Managed with  levothyroxine. Thyroid  function will be assessed with blood tests. - Continue levothyroxine. - Ordered thyroid  function tests.  Allergic rhinitis and atopic dermatitis Managed with Xyzal  and Eucrisa  as needed. - Continue Xyzal  for allergies. - Use Eucrisa  as needed for skin symptoms.  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 Further testing for dairy intolerance is recommended. - Continue gluten-free and dairy-free diet. - Referred to allergy specialist for dairy intolerance testing.  Overweight - discussed nutrition and diet changes - Encouraged more physical activity with goal of 150 minutes a week - consider trial of IF 16:8 - Consider walking after meal time     Return in about 2 months (around 10/13/2024).    Julia Vanrossum, PA-C

## 2024-08-14 ENCOUNTER — Encounter: Payer: Self-pay | Admitting: Physician Assistant

## 2024-08-14 DIAGNOSIS — Z91018 Allergy to other foods: Secondary | ICD-10-CM | POA: Insufficient documentation

## 2024-08-14 DIAGNOSIS — E663 Overweight: Secondary | ICD-10-CM | POA: Insufficient documentation

## 2024-08-14 DIAGNOSIS — N951 Menopausal and female climacteric states: Secondary | ICD-10-CM | POA: Insufficient documentation

## 2024-10-14 ENCOUNTER — Encounter: Payer: Self-pay | Admitting: Physician Assistant

## 2024-10-14 ENCOUNTER — Telehealth: Admitting: Physician Assistant

## 2024-10-14 ENCOUNTER — Other Ambulatory Visit: Payer: Self-pay | Admitting: Physician Assistant

## 2024-10-14 DIAGNOSIS — Z79899 Other long term (current) drug therapy: Secondary | ICD-10-CM

## 2024-10-14 DIAGNOSIS — Z1231 Encounter for screening mammogram for malignant neoplasm of breast: Secondary | ICD-10-CM | POA: Diagnosis not present

## 2024-10-14 DIAGNOSIS — E063 Autoimmune thyroiditis: Secondary | ICD-10-CM

## 2024-10-14 DIAGNOSIS — N951 Menopausal and female climacteric states: Secondary | ICD-10-CM | POA: Diagnosis not present

## 2024-10-14 MED ORDER — ESTRADIOL-NORETHINDRONE ACET 0.5-0.1 MG PO TABS: 1.0000 | ORAL_TABLET | Freq: Every day | ORAL | 1 refills | Status: AC

## 2024-10-14 NOTE — Progress Notes (Signed)
.  Virtual Visit via Video Note  I connected with Donovan Gatchel on 10/14/24 at 10:30 AM EST by a video enabled telemedicine application and verified that I am speaking with the correct person using two identifiers.  Location: Patient: home Provider: home  .Participating in visit:  Patient: Katalyna Provider: Vermell Bologna PA-C Provider in training: Annitta Raw PA-S   I discussed the limitations of evaluation and management by telemedicine and the availability of in person appointments. The patient expressed understanding and agreed to proceed.  History of Present Illness: Discussed the use of AI scribe software for clinical note transcription with the patient, who gave verbal consent to proceed.  History of Present Illness Allona Gondek is a 54 year old female who presents for a follow-up on hormone replacement therapy with Activella.  Perimenopausal symptoms - On Activella for two months - Significant improvement in vaginal dryness and sleep disturbances - Increased energy levels and overall sense of well-being - No side effects from hormone replacement therapy  Preventive health maintenance - Due for a mammogram, which was not scheduled at last visit  Thyroid  disorder - Managed by endocrinology for thyroid  condition - Requests full thyroid  panel to be added to labs for endocrinologist's review      Observations/Objective: No acute distress  Normal breathing Normal mood and appearance   Assessment and Plan: .Roise was seen today for medical management of chronic issues.  Diagnoses and all orders for this visit:  Perimenopausal symptoms -     Estradiol -Norethindrone  Acet 0.5-0.1 MG tablet; Take 1 tablet by mouth daily. -     TSH + free T4 -     T3, free -     Estradiol  -     Progesterone  -     CBC w/Diff/Platelet -     CMP14+EGFR  Medication management -     TSH + free T4 -     T3, free -     MM 3D SCREENING MAMMOGRAM BILATERAL BREAST -     Estradiol  -      Progesterone  -     CBC w/Diff/Platelet -     CMP14+EGFR  Visit for screening mammogram -     MM 3D SCREENING MAMMOGRAM BILATERAL BREAST  Hypothyroidism due to Hashimoto's thyroiditis    Assessment & Plan Perimenopausal symptoms Improvement with Activella. Discussed hormone replacement therapy risks. She is aware of warning signs. - Continue Activella, sent refills today.  - Recheck estradiol  and progesterone  levels next week. - Ordered mammogram and advised imaging to call for appointment.  Hypothyroidism due to Hashimoto's thyroiditis Managed by endocrinology. - Added full thyroid  panel to labs.   Follow up in 6 months for CPE.    Follow Up Instructions:    I discussed the assessment and treatment plan with the patient. The patient was provided an opportunity to ask questions and all were answered. The patient agreed with the plan and demonstrated an understanding of the instructions.   The patient was advised to call back or seek an in-person evaluation if the symptoms worsen or if the condition fails to improve as anticipated.  I provided 15 minutes of non-face-to-face time during this encounter.   Chilton Sallade, PA-C
